# Patient Record
Sex: Female | Born: 1980 | Race: White | Hispanic: No | Marital: Married | Smoking: Never smoker
Health system: Southern US, Community
[De-identification: ages and names within clinical notes are randomized; demographics above are authoritative.]

## PROBLEM LIST (undated history)

## (undated) DIAGNOSIS — J982 Interstitial emphysema: Secondary | ICD-10-CM

## (undated) DIAGNOSIS — G43909 Migraine, unspecified, not intractable, without status migrainosus: Secondary | ICD-10-CM

## (undated) DIAGNOSIS — J45909 Unspecified asthma, uncomplicated: Secondary | ICD-10-CM

## (undated) HISTORY — PX: OVARIAN CYST REMOVAL: SHX89

## (undated) HISTORY — PX: APPENDECTOMY: SHX54

---
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
## 2001-01-25 ENCOUNTER — Ambulatory Visit (HOSPITAL_COMMUNITY): Admission: RE | Admit: 2001-01-25 | Discharge: 2001-01-25 | Payer: Self-pay | Admitting: Obstetrics & Gynecology

## 2001-01-25 ENCOUNTER — Encounter: Payer: Self-pay | Admitting: Obstetrics & Gynecology

## 2001-02-15 ENCOUNTER — Ambulatory Visit (HOSPITAL_COMMUNITY): Admission: RE | Admit: 2001-02-15 | Discharge: 2001-02-15 | Payer: Self-pay | Admitting: *Deleted

## 2001-02-23 ENCOUNTER — Encounter: Admission: RE | Admit: 2001-02-23 | Discharge: 2001-02-23 | Payer: Self-pay | Admitting: *Deleted

## 2001-02-23 ENCOUNTER — Encounter: Admission: AD | Admit: 2001-02-23 | Discharge: 2001-03-25 | Payer: Self-pay | Admitting: *Deleted

## 2001-03-08 ENCOUNTER — Encounter: Payer: Self-pay | Admitting: *Deleted

## 2001-03-09 ENCOUNTER — Inpatient Hospital Stay (HOSPITAL_COMMUNITY): Admission: AD | Admit: 2001-03-09 | Discharge: 2001-03-17 | Payer: Self-pay | Admitting: *Deleted

## 2001-03-11 ENCOUNTER — Encounter: Payer: Self-pay | Admitting: *Deleted

## 2001-03-15 ENCOUNTER — Encounter: Payer: Self-pay | Admitting: *Deleted

## 2001-03-23 ENCOUNTER — Inpatient Hospital Stay (HOSPITAL_COMMUNITY): Admission: AD | Admit: 2001-03-23 | Discharge: 2001-03-23 | Payer: Self-pay | Admitting: *Deleted

## 2001-03-23 ENCOUNTER — Encounter: Admission: RE | Admit: 2001-03-23 | Discharge: 2001-03-23 | Payer: Self-pay | Admitting: *Deleted

## 2001-03-27 ENCOUNTER — Inpatient Hospital Stay (HOSPITAL_COMMUNITY): Admission: AD | Admit: 2001-03-27 | Discharge: 2001-03-27 | Payer: Self-pay | Admitting: *Deleted

## 2001-03-30 ENCOUNTER — Encounter (HOSPITAL_COMMUNITY): Admission: RE | Admit: 2001-03-30 | Discharge: 2001-04-17 | Payer: Self-pay | Admitting: *Deleted

## 2001-04-05 ENCOUNTER — Encounter: Admission: RE | Admit: 2001-04-05 | Discharge: 2001-04-05 | Payer: Self-pay | Admitting: *Deleted

## 2001-04-06 ENCOUNTER — Encounter: Admission: RE | Admit: 2001-04-06 | Discharge: 2001-04-06 | Payer: Self-pay | Admitting: *Deleted

## 2001-04-13 ENCOUNTER — Encounter: Admission: RE | Admit: 2001-04-13 | Discharge: 2001-04-13 | Payer: Self-pay | Admitting: *Deleted

## 2001-04-13 ENCOUNTER — Inpatient Hospital Stay (HOSPITAL_COMMUNITY): Admission: AD | Admit: 2001-04-13 | Discharge: 2001-04-13 | Payer: Self-pay | Admitting: *Deleted

## 2001-04-20 ENCOUNTER — Encounter: Admission: RE | Admit: 2001-04-20 | Discharge: 2001-04-20 | Payer: Self-pay | Admitting: *Deleted

## 2001-04-20 ENCOUNTER — Inpatient Hospital Stay (HOSPITAL_COMMUNITY): Admission: AD | Admit: 2001-04-20 | Discharge: 2001-04-23 | Payer: Self-pay | Admitting: *Deleted

## 2001-11-17 ENCOUNTER — Emergency Department (HOSPITAL_COMMUNITY): Admission: EM | Admit: 2001-11-17 | Discharge: 2001-11-17 | Payer: Self-pay | Admitting: Emergency Medicine

## 2002-03-21 ENCOUNTER — Emergency Department (HOSPITAL_COMMUNITY): Admission: EM | Admit: 2002-03-21 | Discharge: 2002-03-22 | Payer: Self-pay | Admitting: Emergency Medicine

## 2002-03-23 ENCOUNTER — Emergency Department (HOSPITAL_COMMUNITY): Admission: EM | Admit: 2002-03-23 | Discharge: 2002-03-23 | Payer: Self-pay | Admitting: Emergency Medicine

## 2002-03-23 ENCOUNTER — Encounter: Payer: Self-pay | Admitting: Emergency Medicine

## 2002-08-24 ENCOUNTER — Emergency Department (HOSPITAL_COMMUNITY): Admission: EM | Admit: 2002-08-24 | Discharge: 2002-08-24 | Payer: Self-pay | Admitting: Emergency Medicine

## 2002-10-10 ENCOUNTER — Emergency Department (HOSPITAL_COMMUNITY): Admission: EM | Admit: 2002-10-10 | Discharge: 2002-10-10 | Payer: Self-pay

## 2002-10-10 ENCOUNTER — Encounter: Payer: Self-pay | Admitting: Emergency Medicine

## 2002-10-11 ENCOUNTER — Emergency Department (HOSPITAL_COMMUNITY): Admission: EM | Admit: 2002-10-11 | Discharge: 2002-10-12 | Payer: Self-pay | Admitting: Emergency Medicine

## 2003-12-12 ENCOUNTER — Inpatient Hospital Stay (HOSPITAL_COMMUNITY): Admission: AD | Admit: 2003-12-12 | Discharge: 2003-12-12 | Payer: Self-pay | Admitting: Obstetrics and Gynecology

## 2003-12-24 ENCOUNTER — Ambulatory Visit (HOSPITAL_COMMUNITY): Admission: RE | Admit: 2003-12-24 | Discharge: 2003-12-24 | Payer: Self-pay | Admitting: *Deleted

## 2004-03-03 ENCOUNTER — Ambulatory Visit (HOSPITAL_COMMUNITY): Admission: RE | Admit: 2004-03-03 | Discharge: 2004-03-03 | Payer: Self-pay | Admitting: *Deleted

## 2004-04-08 ENCOUNTER — Inpatient Hospital Stay (HOSPITAL_COMMUNITY): Admission: AD | Admit: 2004-04-08 | Discharge: 2004-04-09 | Payer: Self-pay | Admitting: Obstetrics and Gynecology

## 2004-04-15 ENCOUNTER — Observation Stay (HOSPITAL_COMMUNITY): Admission: AD | Admit: 2004-04-15 | Discharge: 2004-04-16 | Payer: Self-pay | Admitting: Obstetrics and Gynecology

## 2004-04-15 ENCOUNTER — Ambulatory Visit: Payer: Self-pay | Admitting: Obstetrics and Gynecology

## 2004-04-17 ENCOUNTER — Inpatient Hospital Stay (HOSPITAL_COMMUNITY): Admission: AD | Admit: 2004-04-17 | Discharge: 2004-04-17 | Payer: Self-pay | Admitting: Family Medicine

## 2004-05-28 ENCOUNTER — Ambulatory Visit (HOSPITAL_COMMUNITY): Admission: RE | Admit: 2004-05-28 | Discharge: 2004-05-28 | Payer: Self-pay | Admitting: Family Medicine

## 2004-06-25 ENCOUNTER — Ambulatory Visit: Payer: Self-pay | Admitting: Obstetrics and Gynecology

## 2004-06-25 ENCOUNTER — Inpatient Hospital Stay (HOSPITAL_COMMUNITY): Admission: AD | Admit: 2004-06-25 | Discharge: 2004-06-25 | Payer: Self-pay | Admitting: *Deleted

## 2004-06-30 ENCOUNTER — Ambulatory Visit: Payer: Self-pay | Admitting: *Deleted

## 2004-06-30 ENCOUNTER — Inpatient Hospital Stay (HOSPITAL_COMMUNITY): Admission: AD | Admit: 2004-06-30 | Discharge: 2004-07-01 | Payer: Self-pay | Admitting: Obstetrics and Gynecology

## 2004-07-30 ENCOUNTER — Inpatient Hospital Stay (HOSPITAL_COMMUNITY): Admission: AD | Admit: 2004-07-30 | Discharge: 2004-08-02 | Payer: Self-pay | Admitting: Family Medicine

## 2004-07-30 ENCOUNTER — Ambulatory Visit: Payer: Self-pay | Admitting: *Deleted

## 2005-09-23 ENCOUNTER — Emergency Department (HOSPITAL_COMMUNITY): Admission: EM | Admit: 2005-09-23 | Discharge: 2005-09-23 | Payer: Self-pay | Admitting: Emergency Medicine

## 2007-05-26 ENCOUNTER — Inpatient Hospital Stay (HOSPITAL_COMMUNITY): Admission: EM | Admit: 2007-05-26 | Discharge: 2007-06-06 | Payer: Self-pay | Admitting: Emergency Medicine

## 2007-05-26 ENCOUNTER — Ambulatory Visit: Payer: Self-pay | Admitting: Surgery

## 2008-02-29 ENCOUNTER — Ambulatory Visit: Payer: Self-pay | Admitting: Critical Care Medicine

## 2008-02-29 DIAGNOSIS — J45909 Unspecified asthma, uncomplicated: Secondary | ICD-10-CM | POA: Insufficient documentation

## 2008-03-01 ENCOUNTER — Encounter: Payer: Self-pay | Admitting: Critical Care Medicine

## 2008-03-04 DIAGNOSIS — R0602 Shortness of breath: Secondary | ICD-10-CM

## 2008-03-05 ENCOUNTER — Ambulatory Visit: Payer: Self-pay | Admitting: Internal Medicine

## 2008-03-06 ENCOUNTER — Encounter: Payer: Self-pay | Admitting: Critical Care Medicine

## 2008-03-14 ENCOUNTER — Ambulatory Visit: Payer: Self-pay | Admitting: Internal Medicine

## 2008-03-14 ENCOUNTER — Encounter: Payer: Self-pay | Admitting: Critical Care Medicine

## 2008-03-14 ENCOUNTER — Ambulatory Visit (HOSPITAL_COMMUNITY): Admission: RE | Admit: 2008-03-14 | Discharge: 2008-03-14 | Payer: Self-pay | Admitting: Critical Care Medicine

## 2008-03-14 ENCOUNTER — Inpatient Hospital Stay (HOSPITAL_COMMUNITY): Admission: EM | Admit: 2008-03-14 | Discharge: 2008-03-21 | Payer: Self-pay | Admitting: Emergency Medicine

## 2008-03-15 ENCOUNTER — Ambulatory Visit: Payer: Self-pay | Admitting: Surgery

## 2008-03-18 ENCOUNTER — Ambulatory Visit: Payer: Self-pay | Admitting: Gastroenterology

## 2008-03-21 ENCOUNTER — Encounter: Payer: Self-pay | Admitting: Critical Care Medicine

## 2008-09-23 ENCOUNTER — Inpatient Hospital Stay (HOSPITAL_COMMUNITY): Admission: AD | Admit: 2008-09-23 | Discharge: 2008-09-23 | Payer: Self-pay | Admitting: Obstetrics and Gynecology

## 2008-10-21 ENCOUNTER — Inpatient Hospital Stay (HOSPITAL_COMMUNITY): Admission: AD | Admit: 2008-10-21 | Discharge: 2008-10-21 | Payer: Self-pay | Admitting: Obstetrics and Gynecology

## 2008-11-04 ENCOUNTER — Inpatient Hospital Stay (HOSPITAL_COMMUNITY): Admission: AD | Admit: 2008-11-04 | Discharge: 2008-11-04 | Payer: Self-pay | Admitting: Obstetrics and Gynecology

## 2008-11-14 ENCOUNTER — Inpatient Hospital Stay (HOSPITAL_COMMUNITY): Admission: AD | Admit: 2008-11-14 | Discharge: 2008-11-16 | Payer: Self-pay | Admitting: Obstetrics and Gynecology

## 2010-02-18 ENCOUNTER — Emergency Department (INDEPENDENT_AMBULATORY_CARE_PROVIDER_SITE_OTHER): Payer: Self-pay

## 2010-02-18 ENCOUNTER — Emergency Department (HOSPITAL_BASED_OUTPATIENT_CLINIC_OR_DEPARTMENT_OTHER)
Admission: EM | Admit: 2010-02-18 | Discharge: 2010-02-18 | Disposition: A | Discharge status: Home / Self Care | Payer: Self-pay | Attending: Emergency Medicine | Admitting: Emergency Medicine

## 2010-02-18 DIAGNOSIS — W1809XA Striking against other object with subsequent fall, initial encounter: Secondary | ICD-10-CM | POA: Insufficient documentation

## 2010-02-18 DIAGNOSIS — W19XXXA Unspecified fall, initial encounter: Secondary | ICD-10-CM

## 2010-02-18 DIAGNOSIS — S63509A Unspecified sprain of unspecified wrist, initial encounter: Secondary | ICD-10-CM | POA: Insufficient documentation

## 2010-02-18 DIAGNOSIS — M79609 Pain in unspecified limb: Secondary | ICD-10-CM

## 2010-02-18 DIAGNOSIS — M25539 Pain in unspecified wrist: Secondary | ICD-10-CM

## 2010-03-13 ENCOUNTER — Emergency Department (INDEPENDENT_AMBULATORY_CARE_PROVIDER_SITE_OTHER): Payer: Self-pay

## 2010-03-13 ENCOUNTER — Emergency Department (HOSPITAL_BASED_OUTPATIENT_CLINIC_OR_DEPARTMENT_OTHER)
Admission: EM | Admit: 2010-03-13 | Discharge: 2010-03-13 | Disposition: A | Discharge status: Home / Self Care | Payer: Self-pay | Attending: Emergency Medicine | Admitting: Emergency Medicine

## 2010-03-13 DIAGNOSIS — R0789 Other chest pain: Secondary | ICD-10-CM | POA: Insufficient documentation

## 2010-03-13 DIAGNOSIS — R1110 Vomiting, unspecified: Secondary | ICD-10-CM

## 2010-03-13 DIAGNOSIS — R071 Chest pain on breathing: Secondary | ICD-10-CM | POA: Insufficient documentation

## 2010-03-13 DIAGNOSIS — H81399 Other peripheral vertigo, unspecified ear: Secondary | ICD-10-CM | POA: Insufficient documentation

## 2010-03-13 DIAGNOSIS — R112 Nausea with vomiting, unspecified: Secondary | ICD-10-CM

## 2010-03-13 DIAGNOSIS — R079 Chest pain, unspecified: Secondary | ICD-10-CM

## 2010-03-13 DIAGNOSIS — R072 Precordial pain: Secondary | ICD-10-CM

## 2010-03-13 DIAGNOSIS — J45909 Unspecified asthma, uncomplicated: Secondary | ICD-10-CM | POA: Insufficient documentation

## 2010-03-13 DIAGNOSIS — R42 Dizziness and giddiness: Secondary | ICD-10-CM

## 2010-03-13 LAB — CBC
HCT: 41.6 % (ref 36.0–46.0)
Hemoglobin: 14.6 g/dL (ref 12.0–15.0)
MCHC: 35.1 g/dL (ref 30.0–36.0)
MCV: 83 fL (ref 78.0–100.0)
RDW: 12.5 % (ref 11.5–15.5)

## 2010-03-13 LAB — BASIC METABOLIC PANEL
BUN: 11 mg/dL (ref 6–23)
CO2: 25 mEq/L (ref 19–32)
Calcium: 9.3 mg/dL (ref 8.4–10.5)
GFR calc non Af Amer: >60 mL/min (ref >60)
Glucose, Bld: 114 mg/dL — ABNORMAL HIGH (ref 70–99)
Potassium: 4.5 mEq/L (ref 3.5–5.1)
Sodium: 145 mEq/L (ref 135–145)

## 2010-03-13 LAB — DIFFERENTIAL
Basophils Absolute: 0 10*3/uL (ref 0.0–0.1)
Eosinophils Relative: 0 % (ref 0–5)
Lymphocytes Relative: 10 % — ABNORMAL LOW (ref 12–46)
Lymphs Abs: 1.3 10*3/uL (ref 0.7–4.0)
Monocytes Absolute: 0.6 10*3/uL (ref 0.1–1.0)
Neutro Abs: 10.8 10*3/uL — ABNORMAL HIGH (ref 1.7–7.7)

## 2010-03-13 LAB — URINALYSIS, ROUTINE W REFLEX MICROSCOPIC
Bilirubin Urine: NEGATIVE
Hgb urine dipstick: NEGATIVE
Ketones, ur: NEGATIVE mg/dL
Nitrite: NEGATIVE
Specific Gravity, Urine: 1.013 (ref 1.005–1.030)
pH: 7.5 (ref 5.0–8.0)

## 2010-03-13 LAB — D-DIMER, QUANTITATIVE: D-Dimer, Quant: <0.22 ug/mL-FEU (ref 0.00–0.48)

## 2010-03-13 MED ORDER — IOHEXOL 350 MG/ML SOLN
80.0000 mL | Freq: Once | INTRAVENOUS | Status: AC | PRN
  Administered 2010-03-13: 80 mL via INTRAVENOUS

## 2010-04-08 LAB — CBC
HCT: 26.5 % — ABNORMAL LOW (ref 36.0–46.0)
HCT: 26.8 % — ABNORMAL LOW (ref 36.0–46.0)
Hemoglobin: 8.7 g/dL — ABNORMAL LOW (ref 12.0–15.0)
Hemoglobin: 8.8 g/dL — ABNORMAL LOW (ref 12.0–15.0)
MCHC: 33 g/dL (ref 30.0–36.0)
MCV: 80.4 fL (ref 78.0–100.0)
MCV: 80.8 fL (ref 78.0–100.0)
RBC: 3.29 MIL/uL — ABNORMAL LOW (ref 3.87–5.11)
RDW: 14.6 % (ref 11.5–15.5)
WBC: 11.3 10*3/uL — ABNORMAL HIGH (ref 4.0–10.5)

## 2010-04-16 LAB — DIFFERENTIAL
Eosinophils Relative: 0 % (ref 0–5)
Lymphocytes Relative: 16 % (ref 12–46)
Lymphs Abs: 1.8 10*3/uL (ref 0.7–4.0)
Monocytes Relative: 5 % (ref 3–12)
Neutrophils Relative %: 79 % — ABNORMAL HIGH (ref 43–77)

## 2010-04-16 LAB — CBC
MCHC: 35.3 g/dL (ref 30.0–36.0)
MCV: 87.2 fL (ref 78.0–100.0)
RBC: 4.24 MIL/uL (ref 3.87–5.11)
RDW: 12.5 % (ref 11.5–15.5)

## 2010-04-16 LAB — COMPREHENSIVE METABOLIC PANEL
AST: 27 U/L (ref 0–37)
CO2: 25 mEq/L (ref 19–32)
Calcium: 8.6 mg/dL (ref 8.4–10.5)
Creatinine, Ser: 0.64 mg/dL (ref 0.4–1.2)
GFR calc Af Amer: >60 mL/min (ref >60)
GFR calc non Af Amer: >60 mL/min (ref >60)
Total Protein: 6.5 g/dL (ref 6.0–8.3)

## 2010-04-16 LAB — PROTIME-INR
INR: 1 (ref 0.00–1.49)
Prothrombin Time: 13.7 seconds (ref 11.6–15.2)

## 2010-04-16 LAB — BASIC METABOLIC PANEL
BUN: 11 mg/dL (ref 6–23)
CO2: 25 mEq/L (ref 19–32)
Calcium: 9.3 mg/dL (ref 8.4–10.5)
Chloride: 98 mEq/L (ref 96–112)
Creatinine, Ser: 0.7 mg/dL (ref 0.4–1.2)
Glucose, Bld: 91 mg/dL (ref 70–99)

## 2010-05-19 NOTE — H&P (Signed)
Amber Weber, POKORNY                ACCOUNT NO.:  [id number]   MEDICAL RECORD NO.:  [id number]          PATIENT TYPE:  INP   LOCATION:  3740                         FACILITY:  [facility]   PHYSICIAN:  Clinton D. Maple Hudson, MD, FCCP, FACPDATE OF BIRTH:  September 19, 1981   DATE OF ADMISSION:  03/14/2008  DATE OF DISCHARGE:                              HISTORY & PHYSICAL   ADMISSION DIAGNOSES:  1. Spontaneous pneumomediastinum.  2. Asthma.   HISTORY OF PRESENT ILLNESS:  This is a 30 year old, otherwise, healthy  nonsmoking white female admitted through the emergency room with chief  complaint of chest pain and pneumomediastinum.  She has a history of  asthma back to childhood with 1 or 2 episodes a year.  She had required  emergency room visits as a child, but not since and her control has been  good with no routine medicines now.  She has a history of a spontaneous  pneumomediastinum in May 2009 requiring hospitalization, but treated  conservatively.  Pain management was an issue, as air dissected up into  her neck and epiglottal region.  She had seen Dr. Delford Field in the office  about 2 weeks ago and was sent for a methacholine inhalation challenge  test today.  I will tract down that record, but by her description the  test was stopped in the first stage with a technician saying that there  was no need to continue.  She was waiting to leave for home when she  became dyspneic progressively with anterior/substernal chest pain, which  did not respond to a nebulizer treatment.  She was sent to the emergency  room.  Initial chest x-ray was negative, but CT scan showed  pneumomediastinum without obvious source defect.  Subsequently, pain has  been treated with morphine and Dilaudid.  She remains dyspneic, now with  tightness and substernal pressure sensation.   PAST MEDICAL HISTORY:  Appendectomy and ovarian cyst.  She think she had  pneumonia in May 2009 while in the hospital for pneumomediastinum,  childbirth.   FAMILY HISTORY:  Father died around age 40 of lung cancer, heavy smoker.  Mother is alive and well.  There is no known family history, otherwise,  of lung disease or liver disease.   SOCIAL HISTORY:  Married with young children.  Works as a Conservator, museum/gallery.  Nonsmoker.  Occasional social alcohol.  She has had both flu  vaccines for this year.   REVIEW OF SYSTEMS:  Tight sensation in the chest, partly relieved by  taking a deep stretching at the side, but aggravated by speech.  This is  more of a pressure sensation than the pain currently.  She denies cough  or wheeze, fever or chills, bloody or purulent discharge.  She had not  recently felt sick with any particular discomfort.  She has not had rash  or adenopathy, headache, blurred vision or slurred speech, syncope,  palpitation or exertional chest pain, nausea, vomiting or reflux,  abdominal pain, change in bowel or bladder, leg pain or swelling of  feet.  There had been no arthralgias.  She is currently saying  that her  throat hurts some, but she does not have a history of dysphagia.  She  has not felt completely easy in her chest since her first  pneumomediastinum a year ago with a waxing and waning sense of  substernal tightness, sometimes difficulty taking a full breath.   OBJECTIVE:  VITAL SIGNS:  Temperature 98.3, BP 120/77, pulse 102, and  respiration 20.  Initial room air saturation was 99%.  GENERAL APPEARANCE:  Calm, alert woman taking an occasional deep sighing  breath.  She is alert, oriented, and pleasant.  SKIN:  Warm and dry.  NECK:  Adenopathy:  None found at the neck, supraclavicular, or axillary  areas.  There is no neck vein distention, stridor, or thyromegaly.  HEENT:  Speech is clear.  Tongue protrudes midline with dry oral mucosa.  CHEST:  Quiet bilateral breath sounds unlabored without wheeze, cough,  rales or rhonchi noted.  Absence of crepitus in the skin and across the  chest.  HEART:   Regular rhythm.  Normal S1 and S2.  No rub or crunch.  ABDOMEN:  Soft without splenomegaly.  Bowel sounds are faintly heard.  EXTREMITIES:  Normal with no cyanosis, clubbing, or edema.  No calf  tenderness or arthritis, is apparent.  PELVIC:  Breast, genitalia, and rectal, not examined and not pertinent.  NEUROLOGIC:  Alert and oriented, appropriately responsive, moving all  extremities.  Extraocular muscles are intact.  Tongue protrudes midline.  Gross hearing and vision are intact.   LABORATORY:  I reviewed the radiology images.  Chest CT shows a  pneumomediastinum.  Lung parenchyma is clear.   IMPRESSION:  Recurrent spontaneous pneumomediastinum.  This episode may  have been associated with the forced expiratory efforts of the  methacholine inhalation challenge test.  We will admit her to an  inpatient telemetry bed for close monitoring, service of Dr. Shan Levans and I will ask Thoracic Surgery consultation to follow her with  [place].  We will maintain bronchodilator status with standard asthma  management for now.      Clinton D. Maple Hudson, MD, Tonny Bollman, FACP  Electronically Signed     CDY/MEDQ  D:  03/14/2008  T:  03/15/2008  Job:  [id number]   cc:   Charlcie Cradle. Delford Field, MD, FCCP

## 2010-05-19 NOTE — H&P (Signed)
NAMESINA, Amber Weber                ACCOUNT NO.:  [id number]   MEDICAL RECORD NO.:  [id number]          PATIENT TYPE:  EMS   LOCATION:  MAJO                         FACILITY:  [facility]   PHYSICIAN:  Madaline Savage, MD        DATE OF BIRTH:  [birth year]   DATE OF ADMISSION:  05/25/2007  DATE OF DISCHARGE:                              HISTORY & PHYSICAL   PRIMARY CARE PHYSICIAN:  None.  This patient is unassigned to [place].   CHIEF COMPLAINT:  Shortness of breath.   HISTORY OF PRESENT ILLNESS:  [name] is a 30 year old Caucasian lady  with a history of bronchial asthma who comes in with shortness of  breath.  She states her asthma is usually well controlled.  She states  has up to two attacks a year and her last attack was sometime in  September last year.  Three days ago she suddenly started feeling short  of breath.  She went to [facility].  She was given some  breathing treatments there and she was started on steroids and some  inhalers.  She took them but her symptoms did not improve and for the  last couple of days her progress symptoms have been progressively been  getting worse, so she went to the Urgent Care today and so she was sent  him here to rule out a pulmonary embolism.  At this time she does state  that her breathing gets worse when she moves around, and she also  complains of some chest discomfort.  She denies any fever or chills at  this time.   PAST MEDICAL HISTORY:  Is significant for asthma.   PAST SURGICAL HISTORY:  She had an appendectomy and ovarian cyst  removal.   ALLERGIES:  No known drug allergies.   CURRENT MEDICATIONS:  She does not take any medications regularly at  home.  She was started on cefdinir, Asmanex inhaler, prednisone and  Proventil at the [facility] 2 days ago.   SOCIAL HISTORY:  There is no history of smoking.  She takes alcohol  occasionally.  Denies any history of drug abuse.  She works as a  Geophysicist/field seismologist.   FAMILY HISTORY:  Her father died at the age of 59 from lung cancer.  Her  mother is 78.  She is healthy.  She is two brothers who are healthy.   REVIEW OF SYSTEMS:  GENERAL:  She denies any recent weight loss, weight  gain.  No fever or chills.  HEENT:  No headaches, no blurred vision.  No  sore throat.  CARDIOVASCULAR:  No chest pain or palpitations.  RESPIRATORY SYSTEM:  She does have shortness of breath but no cough.  GI:  No abdominal pain, nausea, vomiting, diarrhea, constipation.   PHYSICAL EXAMINATION:  She is alert and oriented x3.  VITAL SIGNS:  Temperature is 98, pulse 74, blood pressure 117/85,  respiratory rate 18, oxygen saturation 100% on room air.  HEENT:  Head atraumatic, normocephalic.  Pupils bilaterally equal and  react to light.  Mucous membranes are moist.  NECK:  Supple.  No JVD, no carotid bruit.  CARDIOVASCULAR SYSTEM:  S1, S2 heard.  Regular rate and rhythm.  CHEST:  Decreased air entry bilaterally.  ABDOMEN:  Soft.  Bowel sounds heard.   LABORATORIES:  Show a white count of 17.1, hemoglobin 14.7, platelets  260.  BNP is less than 30.  Sodium is 139, potassium 4.3, BUN 12,  creatinine 0.75.  Troponin less than 0.05.  D-dimer is 0.24.  CT of the  chest showed pneumomediastinum.   IMPRESSION:  1. Acute asthma exacerbation.  2. Pneumomediastinum.  3. Leukocytosis.   PLAN:  1. Asthma exacerbation.  This is a 30 year old lady with a history of      asthma who comes in with an asthma exacerbation.  We will treat her      with nebulizers, IV steroids and oxygen.  I will ask them to check      peak flow on her after her breathing treatments.  2. Pneumomediastinum.  She does have a pneumomediastinum in the CT of      chest.  This is likely probably secondary to heavy breathing and      possible auto-PEEP with increase of pressure.  At this time there      does not seem to be a tension pneumomediastinum.  We will follow      her while she is in the hospital.  Will  get a follow-up x-ray chest      to see its progression.  We can consider calling cardiothoracic      surgeon to see if it is suspected that she has a tension      pneumomediastinum.  3. Leukocytosis.  She does have elevated wbc count but she has been on      steroids for the last couple of days.  We will watch her while she      is in the hospital.  4. I will put her on DVT prophylaxis.      Madaline Savage, MD  Electronically Signed     PKN/MEDQ  D:  05/26/2007  T:  05/26/2007  Job:  [id number]

## 2010-05-19 NOTE — Discharge Summary (Signed)
NAMEKYNNEDI, ZWEIG                ACCOUNT NO.:  [id number]   MEDICAL RECORD NO.:  [id number]          PATIENT TYPE:  INP   LOCATION:  3029                         FACILITY:  [facility]   PHYSICIAN:  Lonia Blood, M.D.DATE OF BIRTH:  July 31, 1980   DATE OF ADMISSION:  05/25/2007  DATE OF DISCHARGE:                               DISCHARGE SUMMARY   To Whom It May Concern:   [name] is a 30 year old female who has been under my care as  an inpatient at [facility] from May 25, 2007 through June 05, 2007.  She  has suffered with a significant asthma exacerbation and a  pneumomediastinum.  Due to significant physical debility and ongoing  pain, she has been advised to refrain from work for a minimum of an  additional week from the time of this dictated summary.  The earliest at  which I would expect her to be ready for return to work would be June 12, 2007.  At the present time, however, I would state that her return date  would be indefinite and recommend that she follow up with her primary  care physician/[facility] prior to June 12, 2007 to determine  the most appropriate date at which she could return to work.   If there are any questions or concerns regarding this letter, please  feel free to contact the office at [phone].      Lonia Blood, M.D.  Electronically Signed     JTM/MEDQ  D:  06/05/2007  T:  06/05/2007  Job:  [id number]

## 2010-05-19 NOTE — Discharge Summary (Signed)
Amber Weber, Amber Weber                ACCOUNT NO.:  [id number]   MEDICAL RECORD NO.:  [id number]          PATIENT TYPE:  INP   LOCATION:  3029                         FACILITY:  [facility]   PHYSICIAN:  Lonia Blood, M.D.DATE OF BIRTH:  Nov 16, 1980   DATE OF ADMISSION:  05/25/2007  DATE OF DISCHARGE:  06/05/2007                               DISCHARGE SUMMARY   PRIMARY CARE PHYSICIAN:  Unassigned/[facility].   DISCHARGE DIAGNOSES:  1. Pneumomediastinum.      a.     Felt to be secondary to ruptured asthmatic blebs.      b.     Significantly improved with follow-up CT scan.      c.     Ongoing odynophagia secondary to localization and epiglottis       - improving at discharge.  2. Asthma with acute asthma exacerbation - resolved.  3. Status post appendectomy.  4. Status post ovarian cyst resection.   ALLERGIES:  NO KNOWN DRUG ALLERGIES.   DISCHARGE MEDICATIONS:  1. Proventil inhaler 1-2 puffs q.4 h., p.r.n. wheezing.  2. Ultram 50 mg 1-2 p.o. q.4 h., p.r.n. pain   FOLLOW UP:  The patient is advised [facility]  within 5 days for reevaluation of her of odynophagia.  If her symptoms  persist, a repeat CT scan of the chest and neck to reevaluate her  pneumomediastinum is recommended.  It is expected at that time that all  air with be completely reabsorbed.   CONSULTATIONS:  Dr. Evelene Croon with cardiothoracic surgery.   PROCEDURES:  1. CT scan the chest, May 27, 2007 - pneumomediastinum with no      findings to suggest acute PE.  Lungs clear of an active process.  2. CT scan of the chest, May 31, 2007 - small volume gas in the      retropharyngeal space of the neck partially visualized at the level      of the glottis, likely having tracked cephalad from the      mediastinum.  Interval resolution of pneumomediastinum.  No acute      cardiopulmonary abnormality.   HOSPITAL COURSE:  Amber Weber is a 30 year old otherwise healthy  female who  presented to the hospital on Jun 02, 2007, with complaints of  severe shortness of breath.  Three days prior to her presentation to the  hospital., she had suffered the acute onset of these symptoms.  She was  evaluated at [facility].  She was given breathing treatments  for an asthma exacerbation and started on steroids, as well as inhalers.  Her symptoms unfortunately did not improve, and she presented to Urgent  [facility] at [facility] for reevaluation.  Out of concern for the possibility of  pulmonary embolism, the patient was sent to the [facility] emergency  room.  CT scan of the chest was carried out which revealed significant  pneumomediastinum, but no evidence of pulmonary embolism.  The patient  was admitted to the acute unit for pain control.  IV analgesics were  administered.  The patient did indeed appear  to still be suffering with  an acute asthma exacerbation and required intravenous steroids, as well  as the typical bronchodilator therapies.  With aggressive management via  these mechanisms, the patient's asthma exacerbation resolved.  Cardiothoracic surgery was consulted and agreed with the primary team  that the likely source of the patient's pneumomediastinum was a  spontaneous rupture of an asthmatic bleb.  No aggressive intervention  was indicated.  With ongoing analgesia, the patient slowly improved.  Follow-up CT scan did in fact reveal resolution of the patient's  pneumomediastinum, but not surprisingly had now indicated that the  patient's free air had moved up into the neck with a significant focus  around the epiglottis.  With this, the patient suffered with significant  of odynophagia.  This extended her hospital stay due to her inability to  maintain adequate p.o. intake.  Intravenous crystalloid support was  continued.  At such time that the patient's symptoms improved  sufficiently to allow p.o. intake, she was cleared for discharge home.  At the time of  her discharge, lungs are clear with no active wheezing.  Vital signs were stable and afebrile.   The patient was counseled quite extensively as to the nature of her  asthma.  She reports that it is usually very well controlled and that  she suffers only 1-2 episodes per year at a maximum.  She was therefore  not felt to be an appropriate candidate for daily use of inhaled steroid  therapy.  She is encouraged to continue to use her albuterol on a p.r.n.  basis.  She is encouraged to report to her primary care physician the  frequency with which she has to use this.  If the patient's asthma  appears to be less well controlled than she has indicated, then one may  consider the resumption of Advair or other inhaled steroid.   On June 05, 2007, the patient was cleared for discharge with stable  vital signs and in an afebrile state.  She will continue Ultram for pain  control.      Lonia Blood, M.D.  Electronically Signed     JTM/MEDQ  D:  06/05/2007  T:  06/05/2007  Job:  [id number]   cc:   [facility]  [facility]

## 2010-05-19 NOTE — Consult Note (Signed)
NAMEBREIANA, Amber Weber                ACCOUNT NO.:  [id number]   MEDICAL RECORD NO.:  [id number]          PATIENT TYPE:  INP   LOCATION:  3740                         FACILITY:  [facility]   PHYSICIAN:  Evelene Croon, M.D.     DATE OF BIRTH:  Mar 09, 1980   DATE OF CONSULTATION:  03/15/2008  DATE OF DISCHARGE:                                 CONSULTATION   REFERRING PHYSICIAN:  Clinton D. Maple Hudson, MD, FCCP, FACP.   REASON FOR CONSULTATION:  Pneumomediastinum.   CLINICAL HISTORY:  I was asked by Dr. Maple Hudson to evaluate Amber Weber for  recurrent pneumomediastinum.  She is a 30 year old woman with a history  of asthma, who I initially saw in May of 2009.  She presented with a  spontaneous pneumomediastinum, coughing, severe chest pain and some  difficulty swallowing as well as shortness of breath.  She had no  pneumothorax at that time and was treated conservatively with pain  medication.  Her symptoms gradually improved.  She said that she  continued to have intermittent episodes where she feels that she cannot  take a full breath and these are relatively short lived.  She said that  she was undergoing a past methacholine inhalation challenge test by Dr.  Delford Field on March 14, 2008.  She said that the test was stopped part of  the way through.  She said that she was waiting to go home when she  became short of breath and developed substernal chest pain similar to  what she had had in the past.  This did not respond to nebulizer  treatment, and she was sent to the emergency room.  Her initial chest x-  ray was negative.  A CT scan of the chest showed pneumomediastinum  without pneumothorax.   PAST MEDICAL HISTORY:  Significant only for asthma.   PAST SURGICAL HISTORY:  Significant for appendectomy and ovarian cyst  removal in the past.   REVIEW OF SYSTEMS:  GENERAL:  She denies any fever or chills.  She has  never had recent weight changes.  She denies fatigue.  EYES:  Negative.  ENT:  Negative.   ENDOCRINE:  She denies diabetes and hypothyroidism.  CARDIOVASCULAR:  She does have some intermittent shortness of breath at  rest and she feels like she cannot take a full breath.  She denies any  chest pain until this episode prompting admission.  She has had no PND  or orthopnea.  She denies peripheral edema.  RESPIRATORY:  She denies  cough and sputum production.  She does have a history of asthma.  GI:  She denies nausea or vomiting.  She has had no melena or bright red  blood per rectum.  GU:  She denies dysuria and hematuria.  NEUROLOGICAL:  She denies any focal weakness or numbness.  She denies dizziness,  syncope.  She has never had a TIA or stroke.  HEMATOLOGICAL:  Negative.  PSYCHIATRIC:  Negative.   ALLERGIES:  None.   SOCIAL HISTORY:  She is a nonsmoker.  She drinks occasional alcohol.  She works as a Geophysicist/field seismologist.  FAMILY HISTORY:  Father died at age 1 from lung cancer.  Her mother is  64 and healthy.  She has 2 brothers who are healthy.   PHYSICAL EXAMINATION:  VITAL SIGNS:  She is afebrile.  Blood pressure is  118/80.  Pulse is 100 and regular.  Respiratory rate is 20 and  unlabored.  Oxygen saturation is 99%.  GENERAL:  She is a well-developed white female in no distress who takes  frequent deep sighs.  HEENT:  Normocephalic and atraumatic.  Pupils are equal and reactive to  light and accommodation.  Extraocular muscles are intact.  Throat is  clear.  NECK:  Normal carotid pulses bilaterally.  There are no bruits.  There  is no adenopathy or thyromegaly.  There is no crepitance.  CARDIAC:  Regular rate and rhythm with normal S1 and S2.  There is no murmur, rub,  or gallop.  LUNGS:  Clear.  ABDOMEN:  Active bowel sounds.  Her abdomen is soft, nontender.  There  are no palpable masses or organomegaly.  EXTREMITIES:  No peripheral edema.  NEUROLOGIC:  Alert and oriented x3.  Motor and sensory exams are grossly  normal.  SKIN:  Warm and dry.   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  Most likely, this is secondary to a small bleb along  the mediastinal surface of the lungs and is ruptured into the  mediastinum.  It is not possible to localize the side of the chest  involved without development of a pneumothorax on that side.  This  recurrent pneumomediastinum may have been brought on by the tests that  she had done.  I do not think this is related to an esophageal  perforation.  I would recommend continued conservative therapy with pain  management.  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      Evelene Croon, M.D.  Electronically Signed     BB/MEDQ  D:  03/16/2008  T:  03/16/2008  Job:  [id number]

## 2010-05-19 NOTE — Consult Note (Signed)
NAMEBONNEY, BERRES                ACCOUNT NO.:  [id number]   MEDICAL RECORD NO.:  [id number]          PATIENT TYPE:  INP   LOCATION:  2631                         FACILITY:  [facility]   PHYSICIAN:  Evelene Croon, M.D.     DATE OF BIRTH:  April 20, 1980   DATE OF CONSULTATION:  05/26/2007  DATE OF DISCHARGE:                                 CONSULTATION   REFERRING PHYSICIAN:  Madaline Savage, MD   REASON FOR CONSULTATION:  Pneumomediastinum.   CLINICAL HISTORY:  I was asked by Dr. Darene Lamer to evaluate Ms. Bonn for  pneumomediastinum seen on CT scan of the chest.  She is a 30 year old  white female with a history of asthma who has had no recent  exacerbations, but developed sudden shortness of breath on Wednesday  while doing no strenuous activity.  There was no coughing before hand.  She said she was unable to drive home.  She developed some central chest  discomfort.  She went to the [facility], where she was  treated for possible asthma exacerbation and was given steroids and  inhaler.  There was no improvement and she returned to an Urgent Care on  [place] today.  She was referred to [facility] and was admitted after a  CT scan of the chest showed no evidence of pulmonary embolism, but did  showed pneumomediastinum.  There were no other abnormalities seen.   PAST MEDICAL HISTORY:  Significant for asthma.  She has had history of  prior appendectomy and ovarian cystectomy.   REVIEW OF SYSTEMS:  GENERAL:  She denies any fever or chills.  She has  had no recent weight changes.  She denies weakness.  ENT:  Negative.  ENDOCRINE:  She denies diabetes and hypothyroidism.  CARDIOVASCULAR:  She has had no history of chest pain or pressure.  She has had no  palpitations.  She denies PND or orthopnea.  She has had no exertional  dyspnea.  RESPIRATORY:  She denies cough and sputum production.  GI:  She denies nausea or vomiting.  She has had no dysphagia.  GU:  She has  had no melena or bright  red blood per rectum.  The remainder of review  of systems is negative.   SOCIAL HISTORY:  She is a nonsmoker and denies alcohol abuse.  She is  married.   FAMILY HISTORY:  Negative.   ALLERGIES:  None.   MEDICATIONS PRIOR TO ADMISSION:  Are as noted on her NAR in this chart.   PHYSICAL EXAMINATION:  VITAL SIGNS:  Blood pressure 114/85, pulse 75 and  regular, and respiratory rate is 18 unlabored.  Oxygen saturation on  room air is 100%.  She is afebrile.  GENERAL:  She is a well-developed white female in no distress.  HEENT:  Normocephalic and atraumatic.  Pupils are equal and reactive to  light.  Extraocular muscles are intact.  Throat is clear.  NECK:  Normal carotid pulses bilaterally.  There are no bruits.  There  is no crepitus.  There was no adenopathy or thyromegaly.  CARDIAC:  Regular rate and  rhythm with normal S1 and S2.  There is no  murmur, rub, or gallop.  LUNGS:  Clear.  There is no subcutaneous emphysema.  ABDOMEN:  Active bowel sounds.  Abdomen is soft and nontender.  There  are no palpable masses or organomegaly.  There is no peripheral edema.   IMPRESSION:  This patient has a history of asthma and now a first  episode of pneumomediastinum.  I suspect this is most likely related to  rupture of a small bleb into the mediastinum.  I do not see any specific  blebs of any size on CT scan of the chest, but this could be a very  small bleb that would not show up on CT scan.  Most of the time this  blebs rupture in to the pleural space causing pneumothorax, but  occasionally can cause pneumomediastinum.  There was no specific  treatment for this and most likely the air will resolve on its own over  the next few weeks.  She may continue to have mild shortness of breath,  sensation, and some chest pains until this resolves.  She should have a  chest x-ray done tomorrow and if there is no change she could be  discharged home with your discussion, whenever you feel that her  asthma  is stable.  I do not think she requires a followup chest x-ray unless  her symptoms worsen.  I have discussed all this with her and told her  there is about 20% chance of recurrence over the next 2 years.  She  should have repeat chest x-ray done if she develops any symptoms like  this in the future.  There was no evidence that this air has come from  any esophageal problem as she has had no dysphagia and no nausea or  vomiting.      Evelene Croon, M.D.  Electronically Signed     BB/MEDQ  D:  05/26/2007  T:  05/27/2007  Job:  [id number]

## 2010-05-22 NOTE — Discharge Summary (Signed)
[facility]  Patient:    Amber Weber, Amber Weber Visit Number: [id number] MRN: [id number]          Service Type: OBS Location: 910A [id number] Attending Physician:  Enid Cutter Dictated by:   Rick Duff, M.D. Admit Date:  04/20/2001 Discharge Date: 04/23/2001                             Discharge Summary  HISTORY/HOSPITAL COURSE:      This 30 year old primigravida was admitted on March 08, 2001 at 32 5/7 weeks estimated gestational age with a chief complaint of nausea and vomiting, abdominal cramping, and decreased fetal movement. 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  OB ultrasound on the fifth showed umbilical artery and middle cerebral artery Dopplers within normal limits, AFI of 17.6, biophysical profile score of 6/8, and fetus in the 29th percentile with asymmetric growth suggestive of IUGR.  Throughout her hospital course the patients vital signs remained stable and fetal heart tracings reassuring.  She was noted to be GBS positive and received seven days of IV Unasyn.  Fetal fibronectin was positive on March 9 and preterm contractions were treated with terbutaline.  She also received a course of betamethasone.  She was discharged on March 14.  Cervical examination at that time was unchanged from admission at fingertip, 60% effaced, and -2 station. She was placed on bed rest at discharge, counseled against sexual activity, and was to follow up at the high risk clinic. Dictated by:   Rick Duff, M.D. Attending Physician:  Enid Cutter DD:  05/26/01 TD:  05/30/01 Job: [id number]

## 2010-05-22 NOTE — Discharge Summary (Signed)
[name], Amber Weber                ACCOUNT NO.:  [id number]   MEDICAL RECORD NO.:  [id number]          PATIENT TYPE:  INP   LOCATION:  3038                         FACILITY:  [facility]   PHYSICIAN:  Charlcie Cradle. Delford Field, MD, FCCPDATE OF BIRTH:  [birth year]   DATE OF ADMISSION:  03/14/2008  DATE OF DISCHARGE:  03/21/2008                               DISCHARGE SUMMARY   DISCHARGE DIAGNOSES:  1. Spontaneous pneumomediastinum.  2. Severe persistent asthma.   HISTORY OF PRESENT ILLNESS:  See history and physical already in the  chart.   HOSPITAL COURSE:  A 30 year old nonsmoking female with asthma was  admitted with pneumomediastinum.  She has underlying moderate persistent  asthma.  She was treated with Solu-Medrol with a pulse and taper.  She  continued to have difficulty with dysphagia.  GI saw this patient in  consultation who did feel that an upper endoscopy was indicated.  It  showed proximal esophagitis.  Pathology was pending.  She was given  Diflucan.  Her dysphagia did improve.  She was ready for discharge by  March 21, 2008.  She was discharged in improved condition.  She will  return in followup in 2 weeks for outpatient pulmonary followup.  She  was given Protonix orally for reflux and a 4-day of course of Diflucan.  She will maintain her current inhaled medication program as prescribed  and return in followup in the office in 2 weeks.      Charlcie Cradle Delford Field, MD, [facility]  Electronically Signed     PEW/MEDQ  D:  07/11/2008  T:  07/11/2008  Job:  [id number]

## 2010-05-22 NOTE — Discharge Summary (Signed)
Amber Weber, Amber Weber             ACCOUNT NO.:  [id number]   MEDICAL RECORD NO.:  [id number]          PATIENT TYPE:  INP   LOCATION:  9162                          FACILITY:  [facility]   PHYSICIAN:  Tanya S. Shawnie Pons, M.D.   DATE OF BIRTH:  09/19/1980   DATE OF ADMISSION:  06/30/2004  DATE OF DISCHARGE:                                 DISCHARGE SUMMARY   FINAL DIAGNOSES:  1.  Intrauterine pregnancy at 35 and zero-sevenths weeks.  2.  Prodromal versus early labor with bloody show.  3.  Reassuring fetal heart rate.   PERTINENT LABORATORY DATA:  Nonreactive RPR. She had a normal DIC panel.  Hemoglobin was 9.7. Radiology:  She had infant in the vertex presentation,  grade 2 placenta, AFI of 16.3, 2805 g, between the 75th and 90th percentile  for infant. Cervix measured 1.8 transvaginally. No evidence of previa or  other problem with the placenta.   REASON FOR ADMISSION:  Briefly, the patient is a 30 year old gravida 2 para  1 with one previous vaginal delivery at 37 weeks who presented with  contractions and vaginal bleeding, was found to be 1-2, 40%, and high with  some blood in the vault and vaginal discharge. She was admitted, placed on  continuous toco and Unasyn for group B strep prophylaxis.   HOSPITAL COURSE:  The patient was admitted and placed on labor and delivery.  She was monitored for 24 hours with significant decrease in her bleeding. On  rest, her contractions stopped, her bleeding slowed down, and she had no  significant cervical change over 24 hour period. The patient's chart was  reviewed. She was found to be GBS negative on last check, which was in  April. The patient lived close by and was felt to be a good candidate for  discharge because she could come back with worsening labor.   DISCHARGE DISPOSITION AND CONDITION:  The patient discharged home in good  condition.   DISCHARGE INSTRUCTIONS:  1.  Follow up at [facility] within the next week.  2.  Take it easy  for the next 1-2 weeks, to rest as much as possible, to      avoid sexual activity as possible.  3.  The patient is instructed to return with worsening contractions,      increasing bleeding, decreased fetal movement.  4.  She can continue prenatal vitamins but otherwise no new medications are      prescribed.       TSP/MEDQ  D:  07/01/2004  T:  07/01/2004  Job:  [id number]

## 2010-09-30 LAB — COMPREHENSIVE METABOLIC PANEL
ALT: 16
ALT: 18
AST: 15
AST: 18
Albumin: 3.6
Alkaline Phosphatase: 66
Alkaline Phosphatase: 67
BUN: 17
Calcium: 8.6
Chloride: 103
Chloride: 97
Creatinine, Ser: 0.77
GFR calc Af Amer: >60
GFR calc Af Amer: >60
Glucose, Bld: 111 — ABNORMAL HIGH
Glucose, Bld: 118 — ABNORMAL HIGH
Potassium: 4.5
Potassium: 4.8
Sodium: 135
Total Bilirubin: 0.5
Total Bilirubin: 0.5
Total Protein: 5.8 — ABNORMAL LOW
Total Protein: 6.3

## 2010-09-30 LAB — CBC
HCT: 40.8
HCT: 42.9
Hemoglobin: 14.2
Hemoglobin: 14.7
MCHC: 35.3
MCV: 86.7
MCV: 87.3
Platelets: 243
Platelets: 260
Platelets: 262
Platelets: 266
RBC: 4.83
RBC: 4.95
RDW: 12.7
RDW: 13.1
WBC: 13.7 — ABNORMAL HIGH
WBC: 15.1 — ABNORMAL HIGH
WBC: 17.1 — ABNORMAL HIGH

## 2010-09-30 LAB — BASIC METABOLIC PANEL
BUN: 12
BUN: 14
Chloride: 101
Creatinine, Ser: 0.75
GFR calc Af Amer: >60
GFR calc non Af Amer: >60
GFR calc non Af Amer: >60
Potassium: 5
Sodium: 138

## 2010-09-30 LAB — DIFFERENTIAL
Lymphocytes Relative: 13
Lymphs Abs: 2.2
Neutro Abs: 13.4 — ABNORMAL HIGH
Neutrophils Relative %: 78 — ABNORMAL HIGH

## 2010-09-30 LAB — SEDIMENTATION RATE: Sed Rate: 2

## 2010-09-30 LAB — POCT CARDIAC MARKERS
CKMB, poc: <1 — ABNORMAL LOW
Troponin i, poc: <0.05

## 2010-09-30 LAB — POCT I-STAT 3, ART BLOOD GAS (G3+)
Operator id: 284591
pCO2 arterial: 27.2 — ABNORMAL LOW
pH, Arterial: 7.533 — ABNORMAL HIGH
pO2, Arterial: 138 — ABNORMAL HIGH

## 2010-09-30 LAB — D-DIMER, QUANTITATIVE: D-Dimer, Quant: 0.24

## 2010-09-30 LAB — MAGNESIUM: Magnesium: 1.8

## 2010-09-30 LAB — B-NATRIURETIC PEPTIDE (CONVERTED LAB): Pro B Natriuretic peptide (BNP): <30

## 2013-07-09 ENCOUNTER — Emergency Department (HOSPITAL_BASED_OUTPATIENT_CLINIC_OR_DEPARTMENT_OTHER)
Admission: EM | Admit: 2013-07-09 | Discharge: 2013-07-10 | Disposition: A | Discharge status: Home / Self Care | Payer: BC Managed Care – PPO | Attending: Emergency Medicine | Admitting: Emergency Medicine

## 2013-07-09 ENCOUNTER — Encounter (HOSPITAL_BASED_OUTPATIENT_CLINIC_OR_DEPARTMENT_OTHER): Payer: Self-pay | Admitting: Emergency Medicine

## 2013-07-09 DIAGNOSIS — N390 Urinary tract infection, site not specified: Secondary | ICD-10-CM | POA: Insufficient documentation

## 2013-07-09 DIAGNOSIS — Z3202 Encounter for pregnancy test, result negative: Secondary | ICD-10-CM | POA: Insufficient documentation

## 2013-07-09 DIAGNOSIS — Z8709 Personal history of other diseases of the respiratory system: Secondary | ICD-10-CM | POA: Insufficient documentation

## 2013-07-09 HISTORY — DX: Interstitial emphysema: J98.2

## 2013-07-09 LAB — URINALYSIS, ROUTINE W REFLEX MICROSCOPIC
BILIRUBIN URINE: NEGATIVE
GLUCOSE, UA: NEGATIVE mg/dL
Hgb urine dipstick: NEGATIVE
KETONES UR: NEGATIVE mg/dL
NITRITE: NEGATIVE
PH: 7 (ref 5.0–8.0)
PROTEIN: NEGATIVE mg/dL
Specific Gravity, Urine: 1.01 (ref 1.005–1.030)
Urobilinogen, UA: 0.2 mg/dL (ref 0.0–1.0)

## 2013-07-09 LAB — CBC WITH DIFFERENTIAL/PLATELET
Basophils Absolute: 0 10*3/uL (ref 0.0–0.1)
Basophils Relative: 0 % (ref 0–1)
Eosinophils Absolute: 0 10*3/uL (ref 0.0–0.7)
Eosinophils Relative: 0 % (ref 0–5)
HCT: 42.1 % (ref 36.0–46.0)
HEMOGLOBIN: 14.4 g/dL (ref 12.0–15.0)
LYMPHS ABS: 2.9 10*3/uL (ref 0.7–4.0)
LYMPHS PCT: 33 % (ref 12–46)
MCH: 30.1 pg (ref 26.0–34.0)
MCHC: 34.2 g/dL (ref 30.0–36.0)
MCV: 87.9 fL (ref 78.0–100.0)
MONOS PCT: 6 % (ref 3–12)
Monocytes Absolute: 0.5 10*3/uL (ref 0.1–1.0)
NEUTROS ABS: 5.2 10*3/uL (ref 1.7–7.7)
NEUTROS PCT: 60 % (ref 43–77)
PLATELETS: 242 10*3/uL (ref 150–400)
RBC: 4.79 MIL/uL (ref 3.87–5.11)
RDW: 12.2 % (ref 11.5–15.5)
WBC: 8.7 10*3/uL (ref 4.0–10.5)

## 2013-07-09 LAB — COMPREHENSIVE METABOLIC PANEL
ALK PHOS: 60 U/L (ref 39–117)
ALT: 18 U/L (ref 0–35)
ANION GAP: 10 (ref 5–15)
AST: 19 U/L (ref 0–37)
Albumin: 4.4 g/dL (ref 3.5–5.2)
BILIRUBIN TOTAL: 0.5 mg/dL (ref 0.3–1.2)
BUN: 11 mg/dL (ref 6–23)
CHLORIDE: 104 meq/L (ref 96–112)
CO2: 27 meq/L (ref 19–32)
Calcium: 9.4 mg/dL (ref 8.4–10.5)
Creatinine, Ser: 0.6 mg/dL (ref 0.50–1.10)
GLUCOSE: 100 mg/dL — AB (ref 70–99)
POTASSIUM: 4.3 meq/L (ref 3.7–5.3)
SODIUM: 141 meq/L (ref 137–147)
TOTAL PROTEIN: 7.7 g/dL (ref 6.0–8.3)

## 2013-07-09 LAB — PREGNANCY, URINE: Preg Test, Ur: NEGATIVE

## 2013-07-09 LAB — URINE MICROSCOPIC-ADD ON

## 2013-07-09 MED ORDER — HYDROCODONE-ACETAMINOPHEN 5-325 MG PO TABS
2.0000 | ORAL_TABLET | Freq: Once | ORAL | Status: AC
  Administered 2013-07-09: 2 via ORAL
  Filled 2013-07-09: qty 2

## 2013-07-09 MED ORDER — HYDROCODONE-ACETAMINOPHEN 5-325 MG PO TABS: 2.0000 | ORAL_TABLET | ORAL | Status: AC | PRN

## 2013-07-09 MED ORDER — CEPHALEXIN 250 MG PO CAPS
500.0000 mg | ORAL_CAPSULE | Freq: Once | ORAL | Status: AC
  Administered 2013-07-10: 500 mg via ORAL
  Filled 2013-07-09: qty 2

## 2013-07-09 MED ORDER — KETOROLAC TROMETHAMINE 60 MG/2ML IM SOLN
60.0000 mg | Freq: Once | INTRAMUSCULAR | Status: AC
  Administered 2013-07-10: 60 mg via INTRAMUSCULAR
  Filled 2013-07-09: qty 2

## 2013-07-09 MED ORDER — ONDANSETRON 4 MG PO TBDP
4.0000 mg | ORAL_TABLET | Freq: Once | ORAL | Status: AC
  Administered 2013-07-10: 4 mg via ORAL
  Filled 2013-07-09: qty 1

## 2013-07-09 MED ORDER — CEPHALEXIN 500 MG PO CAPS: 500.0000 mg | ORAL_CAPSULE | Freq: Four times a day (QID) | ORAL | Status: AC

## 2013-07-09 NOTE — ED Notes (Signed)
Pt. Reports severe pain in her pelvic region bilat. Hip pain and leg pain bilat. From top to bottom.

## 2013-07-09 NOTE — Discharge Instructions (Signed)

## 2013-07-09 NOTE — ED Provider Notes (Signed)
CSN: [id number]     Arrival date & time 07/09/13  1738 History   First MD Initiated Contact with Patient 07/09/13 2042     Chief Complaint  Patient presents with  . Generalized Body Aches     (Consider location/radiation/quality/duration/timing/severity/associated sxs/prior Treatment) Patient is a 33 y.o. female presenting with leg pain. The history is provided by the patient. No language interpreter was used.  Leg Pain Location:  Leg Injury: no   Leg location:  L upper leg and R upper leg Pain details:    Quality:  Aching   Radiates to:  Does not radiate   Severity:  Moderate   Onset quality:  Gradual   Timing:  Constant Chronicity:  New Dislocation: no   Foreign body present:  No foreign bodies Relieved by:  Nothing Worsened by:  Nothing tried Ineffective treatments:  None tried Associated symptoms: no itching   Risk factors: no concern for non-accidental trauma     Past Medical History  Diagnosis Date  . Pneumomediastinum     several times.   Past Surgical History  Procedure Laterality Date  . Appendectomy    . Ovarian cyst removal     No family history on file. History  Substance Use Topics  . Smoking status: Never Smoker   . Smokeless tobacco: Not on file  . Alcohol Use: No   OB History   Grav Para Term Preterm Abortions TAB SAB Ect Mult Living                 Review of Systems  Skin: Negative for itching.  All other systems reviewed and are negative.     Allergies  Morphine and related  Home Medications   Prior to Admission medications   Not on File   BP 133/81  Pulse 93  Temp(Src) 98.2 F (36.8 C) (Oral)  Resp 18  Ht 5\' 7"  (1.702 m)  Wt 140 lb (63.504 kg)  BMI 21.92 kg/m2  SpO2 100% Physical Exam  Nursing note and vitals reviewed. Constitutional: She is oriented to person, place, and time. She appears well-developed and well-nourished.  HENT:  Head: Normocephalic and atraumatic.  Eyes: Pupils are equal, round, and reactive to  light.  Neck: Normal range of motion.  Cardiovascular: Normal rate.   Pulmonary/Chest: Effort normal.  Abdominal: Soft.  Musculoskeletal: Normal range of motion.  Neurological: She is alert and oriented to person, place, and time. She has normal reflexes.  Skin: Skin is warm.    ED Course  Procedures (including critical care time) Labs Review Labs Reviewed  URINALYSIS, ROUTINE W REFLEX MICROSCOPIC - Abnormal; Notable for the following:    Leukocytes, UA MODERATE (*)    All other components within normal limits  URINE MICROSCOPIC-ADD ON - Abnormal; Notable for the following:    Bacteria, UA MANY (*)    All other components within normal limits  PREGNANCY, URINE    Imaging Review No results found.   EKG Interpretation None      MDM labs normal except urine,   WBC's 7-10  Many bacteria.    Pt given 2 hydrocodone for pain.   Pt given rx for keflex.    I will culture urine.   See Primary Md for recheck in 2 days   Final diagnoses:  UTI (lower urinary tract infection)    Keflex hydrocodone    Elson AreasLeslie K Sofia, PA-C 07/09/13 2339

## 2013-07-10 NOTE — ED Provider Notes (Signed)
Medical screening examination/treatment/procedure(s) were performed by non-physician practitioner and as supervising physician I was immediately available for consultation/collaboration.   EKG Interpretation None       Stevens Magwood, MD 07/10/13 0019 

## 2013-07-11 LAB — URINE CULTURE
CULTURE: NO GROWTH
Colony Count: NO GROWTH

## 2013-12-01 ENCOUNTER — Encounter (HOSPITAL_BASED_OUTPATIENT_CLINIC_OR_DEPARTMENT_OTHER): Payer: Self-pay | Admitting: Emergency Medicine

## 2013-12-01 ENCOUNTER — Emergency Department (HOSPITAL_BASED_OUTPATIENT_CLINIC_OR_DEPARTMENT_OTHER): Payer: BC Managed Care – PPO

## 2013-12-01 ENCOUNTER — Emergency Department (HOSPITAL_BASED_OUTPATIENT_CLINIC_OR_DEPARTMENT_OTHER)
Admission: EM | Admit: 2013-12-01 | Discharge: 2013-12-02 | Disposition: A | Discharge status: Home / Self Care | Payer: BC Managed Care – PPO | Attending: Emergency Medicine | Admitting: Emergency Medicine

## 2013-12-01 DIAGNOSIS — W19XXXA Unspecified fall, initial encounter: Secondary | ICD-10-CM

## 2013-12-01 DIAGNOSIS — Y9289 Other specified places as the place of occurrence of the external cause: Secondary | ICD-10-CM | POA: Insufficient documentation

## 2013-12-01 DIAGNOSIS — Y9389 Activity, other specified: Secondary | ICD-10-CM | POA: Diagnosis not present

## 2013-12-01 DIAGNOSIS — R110 Nausea: Secondary | ICD-10-CM | POA: Diagnosis not present

## 2013-12-01 DIAGNOSIS — S9032XA Contusion of left foot, initial encounter: Secondary | ICD-10-CM | POA: Diagnosis not present

## 2013-12-01 DIAGNOSIS — S7002XA Contusion of left hip, initial encounter: Secondary | ICD-10-CM | POA: Insufficient documentation

## 2013-12-01 DIAGNOSIS — S29001A Unspecified injury of muscle and tendon of front wall of thorax, initial encounter: Secondary | ICD-10-CM | POA: Diagnosis present

## 2013-12-01 DIAGNOSIS — W11XXXA Fall on and from ladder, initial encounter: Secondary | ICD-10-CM | POA: Diagnosis not present

## 2013-12-01 DIAGNOSIS — S6992XA Unspecified injury of left wrist, hand and finger(s), initial encounter: Secondary | ICD-10-CM | POA: Diagnosis not present

## 2013-12-01 DIAGNOSIS — Z79899 Other long term (current) drug therapy: Secondary | ICD-10-CM | POA: Insufficient documentation

## 2013-12-01 DIAGNOSIS — S20212A Contusion of left front wall of thorax, initial encounter: Secondary | ICD-10-CM | POA: Diagnosis not present

## 2013-12-01 DIAGNOSIS — S40012A Contusion of left shoulder, initial encounter: Secondary | ICD-10-CM | POA: Diagnosis not present

## 2013-12-01 DIAGNOSIS — S99919A Unspecified injury of unspecified ankle, initial encounter: Secondary | ICD-10-CM | POA: Diagnosis not present

## 2013-12-01 DIAGNOSIS — R079 Chest pain, unspecified: Secondary | ICD-10-CM | POA: Diagnosis not present

## 2013-12-01 DIAGNOSIS — R42 Dizziness and giddiness: Secondary | ICD-10-CM | POA: Insufficient documentation

## 2013-12-01 DIAGNOSIS — Z792 Long term (current) use of antibiotics: Secondary | ICD-10-CM | POA: Insufficient documentation

## 2013-12-01 DIAGNOSIS — Y998 Other external cause status: Secondary | ICD-10-CM | POA: Insufficient documentation

## 2013-12-01 DIAGNOSIS — S3991XA Unspecified injury of abdomen, initial encounter: Secondary | ICD-10-CM | POA: Diagnosis not present

## 2013-12-01 NOTE — ED Notes (Addendum)
Pt presents to ED with complaints of left sided pain after falling landing on hardwood.  Pt reports left arm left hand left hip nausea and dizziness and pain when breathing.  Pt was almost at the top of a 6 foot later when later collapsed and she fell to the floor.  PT doesn't know if she hit head or not.

## 2013-12-02 ENCOUNTER — Emergency Department (HOSPITAL_BASED_OUTPATIENT_CLINIC_OR_DEPARTMENT_OTHER): Payer: BC Managed Care – PPO

## 2013-12-02 LAB — CBC WITH DIFFERENTIAL/PLATELET
BASOS ABS: 0.1 10*3/uL (ref 0.0–0.1)
Basophils Relative: 1 % (ref 0–1)
EOS PCT: 1 % (ref 0–5)
Eosinophils Absolute: 0.1 10*3/uL (ref 0.0–0.7)
HCT: 37.4 % (ref 36.0–46.0)
Hemoglobin: 12.8 g/dL (ref 12.0–15.0)
LYMPHS ABS: 3.1 10*3/uL (ref 0.7–4.0)
LYMPHS PCT: 40 % (ref 12–46)
MCH: 30.3 pg (ref 26.0–34.0)
MCHC: 34.2 g/dL (ref 30.0–36.0)
MCV: 88.6 fL (ref 78.0–100.0)
Monocytes Absolute: 0.7 10*3/uL (ref 0.1–1.0)
Monocytes Relative: 9 % (ref 3–12)
Neutro Abs: 3.9 10*3/uL (ref 1.7–7.7)
Neutrophils Relative %: 49 % (ref 43–77)
Platelets: 169 10*3/uL (ref 150–400)
RBC: 4.22 MIL/uL (ref 3.87–5.11)
RDW: 12.2 % (ref 11.5–15.5)
WBC: 7.7 10*3/uL (ref 4.0–10.5)

## 2013-12-02 LAB — BASIC METABOLIC PANEL
Anion gap: 12 (ref 5–15)
BUN: 12 mg/dL (ref 6–23)
CALCIUM: 8.9 mg/dL (ref 8.4–10.5)
CO2: 23 meq/L (ref 19–32)
CREATININE: 0.6 mg/dL (ref 0.50–1.10)
Chloride: 105 mEq/L (ref 96–112)
GFR calc Af Amer: >90 mL/min (ref >90)
GFR calc non Af Amer: >90 mL/min (ref >90)
GLUCOSE: 93 mg/dL (ref 70–99)
Potassium: 3.9 mEq/L (ref 3.7–5.3)
Sodium: 140 mEq/L (ref 137–147)

## 2013-12-02 MED ORDER — ONDANSETRON HCL 4 MG/2ML IJ SOLN
4.0000 mg | Freq: Once | INTRAMUSCULAR | Status: AC
  Administered 2013-12-02: 4 mg via INTRAVENOUS
  Filled 2013-12-02: qty 2

## 2013-12-02 MED ORDER — SODIUM CHLORIDE 0.9 % IV SOLN
Freq: Once | INTRAVENOUS | Status: AC
  Administered 2013-12-02: 01:00:00 via INTRAVENOUS

## 2013-12-02 MED ORDER — HYDROMORPHONE HCL 1 MG/ML IJ SOLN
1.0000 mg | Freq: Once | INTRAMUSCULAR | Status: AC
  Administered 2013-12-02: 1 mg via INTRAVENOUS
  Filled 2013-12-02: qty 1

## 2013-12-02 MED ORDER — IOHEXOL 300 MG/ML  SOLN
100.0000 mL | Freq: Once | INTRAMUSCULAR | Status: AC | PRN
  Administered 2013-12-02: 100 mL via INTRAVENOUS

## 2013-12-02 MED ORDER — OXYCODONE-ACETAMINOPHEN 5-325 MG PO TABS: 1.0000 | ORAL_TABLET | ORAL | Status: AC | PRN

## 2013-12-02 MED ORDER — OXYCODONE-ACETAMINOPHEN 5-325 MG PO TABS: 1.0000 | ORAL_TABLET | ORAL | Status: DC | PRN

## 2013-12-02 NOTE — Discharge Instructions (Signed)
Take ibuprofen, naproxen, or acetaminophen as needed for less severe pain.  Contusion A contusion is a deep bruise. Contusions are the result of an injury that caused bleeding under the skin. The contusion may turn blue, purple, or yellow. Minor injuries will give you a painless contusion, but more severe contusions may stay painful and swollen for a few weeks.  CAUSES  A contusion is usually caused by a blow, trauma, or direct force to an area of the body. SYMPTOMS   Swelling and redness of the injured area.  Bruising of the injured area.  Tenderness and soreness of the injured area.  Pain. DIAGNOSIS  The diagnosis can be made by taking a history and physical exam. An X-ray, CT scan, or MRI may be needed to determine if there were any associated injuries, such as fractures. TREATMENT  Specific treatment will depend on what area of the body was injured. In general, the best treatment for a contusion is resting, icing, elevating, and applying cold compresses to the injured area. Over-the-counter medicines may also be recommended for pain control. Ask your caregiver what the best treatment is for your contusion. HOME CARE INSTRUCTIONS   Put ice on the injured area.  Put ice in a plastic bag.  Place a towel between your skin and the bag.  Leave the ice on for 15-20 minutes, 3-4 times a day, or as directed by your health care provider.  Only take over-the-counter or prescription medicines for pain, discomfort, or fever as directed by your caregiver. Your caregiver may recommend avoiding anti-inflammatory medicines (aspirin, ibuprofen, and naproxen) for 48 hours because these medicines may increase bruising.  Rest the injured area.  If possible, elevate the injured area to reduce swelling. SEEK IMMEDIATE MEDICAL CARE IF:   You have increased bruising or swelling.  You have pain that is getting worse.  Your swelling or pain is not relieved with medicines. MAKE SURE YOU:    Understand these instructions.  Will watch your condition.  Will get help right away if you are not doing well or get worse. Document Released: 09/30/2004 Document Revised: 12/26/2012 Document Reviewed: 10/26/2010 [facility] Patient Information 2015 [place], [place]. This information is not intended to replace advice given to you by your health care provider. Make sure you discuss any questions you have with your health care provider.  Fall Prevention and Home Safety Falls cause injuries and can affect all age groups. It is possible to use preventive measures to significantly decrease the likelihood of falls. There are many simple measures which can make your home safer and prevent falls. OUTDOORS  Repair cracks and edges of walkways and driveways.  Remove high doorway thresholds.  Trim shrubbery on the main path into your home.  Have good outside lighting.  Clear walkways of tools, rocks, debris, and clutter.  Check that handrails are not broken and are securely fastened. Both sides of steps should have handrails.  Have leaves, snow, and ice cleared regularly.  Use sand or salt on walkways during winter months.  In the garage, clean up grease or oil spills. BATHROOM  Install night lights.  Install grab bars by the toilet and in the tub and shower.  Use non-skid mats or decals in the tub or shower.  Place a plastic non-slip stool in the shower to sit on, if needed.  Keep floors dry and clean up all water on the floor immediately.  Remove soap buildup in the tub or shower on a regular basis.  Secure bath  mats with non-slip, double-sided rug tape.  Remove throw rugs and tripping hazards from the floors. BEDROOMS  Install night lights.  Make sure a bedside light is easy to reach.  Do not use oversized bedding.  Keep a telephone by your bedside.  Have a firm chair with side arms to use for getting dressed.  Remove throw rugs and tripping hazards from the  floor. KITCHEN  Keep handles on pots and pans turned toward the center of the stove. Use back burners when possible.  Clean up spills quickly and allow time for drying.  Avoid walking on wet floors.  Avoid hot utensils and knives.  Position shelves so they are not too high or low.  Place commonly used objects within easy reach.  If necessary, use a sturdy step stool with a grab bar when reaching.  Keep electrical cables out of the way.  Do not use floor polish or wax that makes floors slippery. If you must use wax, use non-skid floor wax.  Remove throw rugs and tripping hazards from the floor. STAIRWAYS  Never leave objects on stairs.  Place handrails on both sides of stairways and use them. Fix any loose handrails. Make sure handrails on both sides of the stairways are as long as the stairs.  Check carpeting to make sure it is firmly attached along stairs. Make repairs to worn or loose carpet promptly.  Avoid placing throw rugs at the top or bottom of stairways, or properly secure the rug with carpet tape to prevent slippage. Get rid of throw rugs, if possible.  Have an electrician put in a light switch at the top and bottom of the stairs. OTHER FALL PREVENTION TIPS  Wear low-heel or rubber-soled shoes that are supportive and fit well. Wear closed toe shoes.  When using a stepladder, make sure it is fully opened and both spreaders are firmly locked. Do not climb a closed stepladder.  Add color or contrast paint or tape to grab bars and handrails in your home. Place contrasting color strips on first and last steps.  Learn and use mobility aids as needed. Install an electrical emergency response system.  Turn on lights to avoid dark areas. Replace light bulbs that burn out immediately. Get light switches that glow.  Arrange furniture to create clear pathways. Keep furniture in the same place.  Firmly attach carpet with non-skid or double-sided tape.  Eliminate uneven  floor surfaces.  Select a carpet pattern that does not visually hide the edge of steps.  Be aware of all pets. OTHER HOME SAFETY TIPS  Set the water temperature for 120 F (48.8 C).  Keep emergency numbers on or near the telephone.  Keep smoke detectors on every level of the home and near sleeping areas. Document Released: 12/11/2001 Document Revised: 06/22/2011 Document Reviewed: 03/12/2011 [facility] Patient Information 2015 [place]. This information is not intended to replace advice given to you by your health care provider. Make sure you discuss any questions you have with your health care provider.  Acetaminophen; Oxycodone tablets What is this medicine? ACETAMINOPHEN; OXYCODONE (a set a MEE noe fen; ox i KOE done) is a pain reliever. It is used to treat mild to moderate pain. This medicine may be used for other purposes; ask your health care provider or pharmacist if you have questions. COMMON BRAND NAME(S): Endocet, Magnacet, Narvox, Percocet, Perloxx, Primalev, Primlev, Roxicet, Xolox What should I tell my health care provider before I take this medicine? They need to know if you  have any of these conditions: -brain tumor -Crohn's disease, inflammatory bowel disease, or ulcerative colitis -drug abuse or addiction -head injury -heart or circulation problems -if you often drink alcohol -kidney disease or problems going to the bathroom -liver disease -lung disease, asthma, or breathing problems -an unusual or allergic reaction to acetaminophen, oxycodone, other opioid analgesics, other medicines, foods, dyes, or preservatives -pregnant or trying to get pregnant -breast-feeding How should I use this medicine? Take this medicine by mouth with a full glass of water. Follow the directions on the prescription label. Take your medicine at regular intervals. Do not take your medicine more often than directed. Talk to your pediatrician regarding the use of this medicine in  children. Special care may be needed. Patients over 32 years old may have a stronger reaction and need a smaller dose. Overdosage: If you think you have taken too much of this medicine contact a poison control center or emergency room at once. NOTE: This medicine is only for you. Do not share this medicine with others. What if I miss a dose? If you miss a dose, take it as soon as you can. If it is almost time for your next dose, take only that dose. Do not take double or extra doses. What may interact with this medicine? -alcohol -antihistamines -barbiturates like amobarbital, butalbital, butabarbital, methohexital, pentobarbital, phenobarbital, thiopental, and secobarbital -benztropine -drugs for bladder problems like solifenacin, trospium, oxybutynin, tolterodine, hyoscyamine, and methscopolamine -drugs for breathing problems like ipratropium and tiotropium -drugs for certain stomach or intestine problems like propantheline, homatropine methylbromide, glycopyrrolate, atropine, belladonna, and dicyclomine -general anesthetics like etomidate, ketamine, nitrous oxide, propofol, desflurane, enflurane, halothane, isoflurane, and sevoflurane -medicines for depression, anxiety, or psychotic disturbances -medicines for sleep -muscle relaxants -naltrexone -narcotic medicines (opiates) for pain -phenothiazines like perphenazine, thioridazine, chlorpromazine, mesoridazine, fluphenazine, prochlorperazine, promazine, and trifluoperazine -scopolamine -tramadol -trihexyphenidyl This list may not describe all possible interactions. Give your health care provider a list of all the medicines, herbs, non-prescription drugs, or dietary supplements you use. Also tell them if you smoke, drink alcohol, or use illegal drugs. Some items may interact with your medicine. What should I watch for while using this medicine? Tell your doctor or health care professional if your pain does not go away, if it gets worse,  or if you have new or a different type of pain. You may develop tolerance to the medicine. Tolerance means that you will need a higher dose of the medication for pain relief. Tolerance is normal and is expected if you take this medicine for a long time. Do not suddenly stop taking your medicine because you may develop a severe reaction. Your body becomes used to the medicine. This does NOT mean you are addicted. Addiction is a behavior related to getting and using a drug for a non-medical reason. If you have pain, you have a medical reason to take pain medicine. Your doctor will tell you how much medicine to take. If your doctor wants you to stop the medicine, the dose will be slowly lowered over time to avoid any side effects. You may get drowsy or dizzy. Do not drive, use machinery, or do anything that needs mental alertness until you know how this medicine affects you. Do not stand or sit up quickly, especially if you are an older patient. This reduces the risk of dizzy or fainting spells. Alcohol may interfere with the effect of this medicine. Avoid alcoholic drinks. There are different types of narcotic medicines (opiates) for pain. If  you take more than one type at the same time, you may have more side effects. Give your health care provider a list of all medicines you use. Your doctor will tell you how much medicine to take. Do not take more medicine than directed. Call emergency for help if you have problems breathing. The medicine will cause constipation. Try to have a bowel movement at least every 2 to 3 days. If you do not have a bowel movement for 3 days, call your doctor or health care professional. Do not take Tylenol (acetaminophen) or medicines that have acetaminophen with this medicine. Too much acetaminophen can be very dangerous. Many nonprescription medicines contain acetaminophen. Always read the labels carefully to avoid taking more acetaminophen. What side effects may I notice from  receiving this medicine? Side effects that you should report to your doctor or health care professional as soon as possible: -allergic reactions like skin rash, itching or hives, swelling of the face, lips, or tongue -breathing difficulties, wheezing -confusion -light headedness or fainting spells -severe stomach pain -unusually weak or tired -yellowing of the skin or the whites of the eyes Side effects that usually do not require medical attention (report to your doctor or health care professional if they continue or are bothersome): -dizziness -drowsiness -nausea -vomiting This list may not describe all possible side effects. Call your doctor for medical advice about side effects. You may report side effects to FDA at 1-800-FDA-1088. Where should I keep my medicine? Keep out of the reach of children. This medicine can be abused. Keep your medicine in a safe place to protect it from theft. Do not share this medicine with anyone. Selling or giving away this medicine is dangerous and against the law. Store at room temperature between 20 and 25 degrees C (68 and 77 degrees F). Keep container tightly closed. Protect from light. This medicine may cause accidental overdose and death if it is taken by other adults, children, or pets. Flush any unused medicine down the toilet to reduce the chance of harm. Do not use the medicine after the expiration date. NOTE: This sheet is a summary. It may not cover all possible information. If you have questions about this medicine, talk to your doctor, pharmacist, or health care provider.  2015, [employer]. (2012-08-14 13:17:35)

## 2013-12-02 NOTE — ED Provider Notes (Signed)
CSN: [id number]     Arrival date & time 12/01/13  2226 History  This chart was scribed for Amber Boozeavid Reyonna Haack, MD by Gwenyth Oberatherine Macek, ED Scribe. This patient was seen in room MH10/MH10 and the patient's care was started at 12:32 AM.   Chief Complaint  Patient presents with  . Fall   The history is provided by the patient. No language interpreter was used.    HPI Comments: Amber Weber is a 33 y.o. female who presents to the Emergency Department complaining of constant, 8/10, left hand, left flank, left rib cage and left toe pain after falling 7-8 feet down folding stairs earlier today. Pt notes nausea and dizziness as associated symptoms. She does not know if she hit her head during the fall. Pt states morphine gave her one occurrence of rash after 3 weeks of administration. She denies any other injuries.  [facility]  Past Medical History  Diagnosis Date  . Pneumomediastinum     several times.   Past Surgical History  Procedure Laterality Date  . Appendectomy    . Ovarian cyst removal     No family history on file. History  Substance Use Topics  . Smoking status: Never Smoker   . Smokeless tobacco: Not on file  . Alcohol Use: No   OB History    No data available     Review of Systems  Gastrointestinal: Positive for nausea.  Musculoskeletal: Positive for arthralgias.  Neurological: Positive for dizziness.  All other systems reviewed and are negative.   Allergies  Morphine and related  Home Medications   Prior to Admission medications   Medication Sig Start Date End Date Taking? Authorizing Provider  cephALEXin (KEFLEX) 500 MG capsule Take 1 capsule (500 mg total) by mouth 4 (four) times daily. 07/09/13   Elson AreasLeslie K Sofia, PA-C  HYDROcodone-acetaminophen (NORCO/VICODIN) 5-325 MG per tablet Take 2 tablets by mouth every 4 (four) hours as needed. 07/09/13   Lonia SkinnerLeslie K Sofia, PA-C   BP 144/102 mmHg  Pulse 119  Temp(Src) 98.2 F (36.8 C) (Oral)  Resp 20  Ht 5'  6" (1.676 m)  Wt 145 lb (65.772 kg)  BMI 23.41 kg/m2  SpO2 100% Physical Exam  Constitutional: She is oriented to person, place, and time. She appears well-developed and well-nourished. No distress.  HENT:  Head: Normocephalic and atraumatic.  Eyes: Conjunctivae and EOM are normal. Pupils are equal, round, and reactive to light.  Neck: Normal range of motion. Neck supple. No JVD present. No tracheal deviation present.  Mild tenderness diffusely  Cardiovascular: Normal rate, regular rhythm and normal heart sounds.   No murmur heard. Pulmonary/Chest: Effort normal and breath sounds normal. No respiratory distress. She has no wheezes. She has no rales. She exhibits tenderness.  Moderate tenderness left posterior and lateral rib cage. No crepitus.   Abdominal: Soft. She exhibits no mass. There is tenderness. There is no rebound and no guarding.  Moderate tenderness left mid and upper abdomen.  Musculoskeletal: She exhibits tenderness. She exhibits no edema.  Marked tenderness left shoulder, ulnar aspect of left hand, left hip and medial aspect of left foot. Pain on passive ROM left shoulder, hip and ankle. Neurovascularly intact.  Lymphadenopathy:    She has no cervical adenopathy.  Neurological: She is alert and oriented to person, place, and time. She has normal reflexes. No cranial nerve deficit. Coordination normal.  Skin: Skin is warm and dry. No rash noted.  Psychiatric: She has a normal mood  and affect. Her behavior is normal. Thought content normal.  Nursing note and vitals reviewed.   ED Course  Procedures (including critical care time) DIAGNOSTIC STUDIES: Oxygen Saturation is 100% on RA, normal by my interpretation.    COORDINATION OF CARE: 12:42 AM Discussed treatment plan with pt which includes CT abdomen pelvis, CT head, CT cervical spine, and CT chest. Pt agreed to plan.  Labs Review Results for orders placed or performed during the hospital encounter of 12/01/13  CBC  with Differential  Result Value Ref Range   WBC 7.7 4.0 - 10.5 K/uL   RBC 4.22 3.87 - 5.11 MIL/uL   Hemoglobin 12.8 12.0 - 15.0 g/dL   HCT 81.137.4 91.436.0 - 78.246.0 %   MCV 88.6 78.0 - 100.0 fL   MCH 30.3 26.0 - 34.0 pg   MCHC 34.2 30.0 - 36.0 g/dL   RDW 95.612.2 21.311.5 - 08.615.5 %   Platelets 169 150 - 400 K/uL   Neutrophils Relative % 49 43 - 77 %   Neutro Abs 3.9 1.7 - 7.7 K/uL   Lymphocytes Relative 40 12 - 46 %   Lymphs Abs 3.1 0.7 - 4.0 K/uL   Monocytes Relative 9 3 - 12 %   Monocytes Absolute 0.7 0.1 - 1.0 K/uL   Eosinophils Relative 1 0 - 5 %   Eosinophils Absolute 0.1 0.0 - 0.7 K/uL   Basophils Relative 1 0 - 1 %   Basophils Absolute 0.1 0.0 - 0.1 K/uL  Basic metabolic panel  Result Value Ref Range   Sodium 140 137 - 147 mEq/L   Potassium 3.9 3.7 - 5.3 mEq/L   Chloride 105 96 - 112 mEq/L   CO2 23 19 - 32 mEq/L   Glucose, Bld 93 70 - 99 mg/dL   BUN 12 6 - 23 mg/dL   Creatinine, Ser 5.780.60 0.50 - 1.10 mg/dL   Calcium 8.9 8.4 - 46.910.5 mg/dL   GFR calc non Af Amer >90 >90 mL/min   GFR calc Af Amer >90 >90 mL/min   Anion gap 12 5 - 15    Imaging Review Dg Ribs Unilateral W/chest Left  12/01/2013   CLINICAL DATA:  LEFT-sided pain after a fall on hardwood floor from 6 foot ladder. Dizziness and nausea.  EXAM: LEFT RIBS AND CHEST - 3+ VIEW  COMPARISON:  CT of the chest March 13, 2010  FINDINGS: No fracture or other bone lesions are seen involving the ribs. There is no evidence of pneumothorax or pleural effusion. Both lungs are clear. Heart size and mediastinal contours are within normal limits.  IMPRESSION: Negative.   Electronically Signed   By: Awilda Metroourtnay  Bloomer   On: 12/01/2013 23:30   Dg Elbow Complete Left  12/01/2013   CLINICAL DATA:  Status post fall six feet off ladder. Landed on hardwood floor. Left-sided elbow pain. Initial encounter.  EXAM: LEFT ELBOW - COMPLETE 3+ VIEW  COMPARISON:  None.  FINDINGS: There is no evidence of fracture or dislocation. The visualized joint spaces are  preserved. No significant joint effusion is identified. The soft tissues are unremarkable in appearance.  IMPRESSION: No evidence of fracture or dislocation.   Electronically Signed   By: Roanna RaiderJeffery  Chang M.D.   On: 12/01/2013 23:23   Dg Hip Complete Left  12/01/2013   CLINICAL DATA:  33 year old female with left-sided pain after falling off of a ladder  EXAM: LEFT HIP - COMPLETE 2+ VIEW  COMPARISON:  None  FINDINGS: No evidence of acute fracture or  malalignment. Normal bony mineralization. No lytic or blastic osseous lesion. IUD in the expected location overlying anatomic pelvis. The femoral head is located.  IMPRESSION: Negative.   Electronically Signed   By: Malachy Moan M.D.   On: 12/01/2013 23:44   Ct Head Wo Contrast  12/02/2013   CLINICAL DATA:  Larey Seat 8 feet down stairs from attic today. Possible head injury. Nausea and dizziness.  EXAM: CT HEAD WITHOUT CONTRAST  CT CERVICAL SPINE WITHOUT CONTRAST  TECHNIQUE: Multidetector CT imaging of the head and cervical spine was performed following the standard protocol without intravenous contrast. Multiplanar CT image reconstructions of the cervical spine were also generated.  COMPARISON:  None.  FINDINGS: CT HEAD FINDINGS  The ventricles and sulci are normal. No intraparenchymal hemorrhage, mass effect nor midline shift. No acute large vascular territory infarcts.  No abnormal extra-axial fluid collections. Basal cisterns are patent.  No skull fracture. The included ocular globes and orbital contents are non-suspicious. The mastoid aircells and included paranasal sinuses are well-aerated.  CT CERVICAL SPINE FINDINGS  Cervical vertebral bodies and posterior elements are intact and aligned with broad reversed cervical lordosis. Intervertebral disc heights preserved. No destructive bony lesions. C1-2 articulation maintained. Included prevertebral and paraspinal soft tissues are unremarkable.  IMPRESSION: CT HEAD: No acute intracranial process ; normal  noncontrast CT of the head.  CT CERVICAL SPINE: Broad reversed cervical lordosis without acute fracture or malalignment.   Electronically Signed   By: Awilda Metro   On: 12/02/2013 02:39   Ct Chest W Contrast  12/02/2013   CLINICAL DATA:  Status post fall down 7-8 folding stairs of attic earlier today, with acute onset of nausea and dizziness. Patient does not know if she hit her head; left flank and left chest pain. Initial encounter.  EXAM: CT CHEST, ABDOMEN, AND PELVIS WITH CONTRAST  TECHNIQUE: Multidetector CT imaging of the chest, abdomen and pelvis was performed following the standard protocol during bolus administration of intravenous contrast.  CONTRAST:  OMNIPAQUE IOHEXOL 300 MG/ML  SOLN  COMPARISON:  CTA of the chest performed 03/13/2010  FINDINGS: CT CHEST FINDINGS  Minimal bilateral atelectasis is noted. The lungs are otherwise clear. There is no evidence of focal consolidation, pleural effusion or pneumothorax. No pulmonary parenchymal contusion is seen. No mass is identified.  The mediastinum is unremarkable in appearance. There is no evidence of mediastinal lymphadenopathy. No pericardial effusion is identified. The great vessels are grossly unremarkable in appearance. There is no evidence of venous hemorrhage. The thyroid gland is unremarkable. No axillary lymphadenopathy is seen.  There is no evidence of significant soft tissue injury along the chest wall.  No acute osseous abnormalities are seen.  CT ABDOMEN AND PELVIS FINDINGS  No free air or free fluid is seen within the abdomen or pelvis. There is no evidence of solid or hollow organ injury.  The liver and spleen are unremarkable in appearance. The gallbladder is within normal limits. The pancreas and adrenal glands are unremarkable.  Mild scarring is noted with regard to the left kidney. The kidneys are otherwise unremarkable. There is no evidence of hydronephrosis. No renal or ureteral stones are seen. No perinephric stranding  is appreciated.  The small bowel is unremarkable in appearance. The stomach is within normal limits. No acute vascular abnormalities are seen.  The patient is status post appendectomy. The colon is unremarkable in appearance.  The bladder is mildly distended and grossly unremarkable. The uterus is within normal limits. The patient's intrauterine device is noted  in expected position, at the fundus of the uterus. The ovaries are relatively symmetric. No suspicious adnexal masses are seen. No inguinal lymphadenopathy is seen.  No acute osseous abnormalities are identified.  IMPRESSION: 1. No evidence of traumatic injury to the chest, abdomen or pelvis. 2. Minimal bilateral atelectasis noted; lungs otherwise clear.   Electronically Signed   By: Roanna Raider M.D.   On: 12/02/2013 02:53   Ct Cervical Spine Wo Contrast  12/02/2013   CLINICAL DATA:  Larey Seat 8 feet down stairs from attic today. Possible head injury. Nausea and dizziness.  EXAM: CT HEAD WITHOUT CONTRAST  CT CERVICAL SPINE WITHOUT CONTRAST  TECHNIQUE: Multidetector CT imaging of the head and cervical spine was performed following the standard protocol without intravenous contrast. Multiplanar CT image reconstructions of the cervical spine were also generated.  COMPARISON:  None.  FINDINGS: CT HEAD FINDINGS  The ventricles and sulci are normal. No intraparenchymal hemorrhage, mass effect nor midline shift. No acute large vascular territory infarcts.  No abnormal extra-axial fluid collections. Basal cisterns are patent.  No skull fracture. The included ocular globes and orbital contents are non-suspicious. The mastoid aircells and included paranasal sinuses are well-aerated.  CT CERVICAL SPINE FINDINGS  Cervical vertebral bodies and posterior elements are intact and aligned with broad reversed cervical lordosis. Intervertebral disc heights preserved. No destructive bony lesions. C1-2 articulation maintained. Included prevertebral and paraspinal soft tissues  are unremarkable.  IMPRESSION: CT HEAD: No acute intracranial process ; normal noncontrast CT of the head.  CT CERVICAL SPINE: Broad reversed cervical lordosis without acute fracture or malalignment.   Electronically Signed   By: Awilda Metro   On: 12/02/2013 02:39   Ct Abdomen Pelvis W Contrast  12/02/2013   CLINICAL DATA:  Status post fall down 7-8 folding stairs of attic earlier today, with acute onset of nausea and dizziness. Patient does not know if she hit her head; left flank and left chest pain. Initial encounter.  EXAM: CT CHEST, ABDOMEN, AND PELVIS WITH CONTRAST  TECHNIQUE: Multidetector CT imaging of the chest, abdomen and pelvis was performed following the standard protocol during bolus administration of intravenous contrast.  CONTRAST:  OMNIPAQUE IOHEXOL 300 MG/ML  SOLN  COMPARISON:  CTA of the chest performed 03/13/2010  FINDINGS: CT CHEST FINDINGS  Minimal bilateral atelectasis is noted. The lungs are otherwise clear. There is no evidence of focal consolidation, pleural effusion or pneumothorax. No pulmonary parenchymal contusion is seen. No mass is identified.  The mediastinum is unremarkable in appearance. There is no evidence of mediastinal lymphadenopathy. No pericardial effusion is identified. The great vessels are grossly unremarkable in appearance. There is no evidence of venous hemorrhage. The thyroid gland is unremarkable. No axillary lymphadenopathy is seen.  There is no evidence of significant soft tissue injury along the chest wall.  No acute osseous abnormalities are seen.  CT ABDOMEN AND PELVIS FINDINGS  No free air or free fluid is seen within the abdomen or pelvis. There is no evidence of solid or hollow organ injury.  The liver and spleen are unremarkable in appearance. The gallbladder is within normal limits. The pancreas and adrenal glands are unremarkable.  Mild scarring is noted with regard to the left kidney. The kidneys are otherwise unremarkable. There is no  evidence of hydronephrosis. No renal or ureteral stones are seen. No perinephric stranding is appreciated.  The small bowel is unremarkable in appearance. The stomach is within normal limits. No acute vascular abnormalities are seen.  The patient is  status post appendectomy. The colon is unremarkable in appearance.  The bladder is mildly distended and grossly unremarkable. The uterus is within normal limits. The patient's intrauterine device is noted in expected position, at the fundus of the uterus. The ovaries are relatively symmetric. No suspicious adnexal masses are seen. No inguinal lymphadenopathy is seen.  No acute osseous abnormalities are identified.  IMPRESSION: 1. No evidence of traumatic injury to the chest, abdomen or pelvis. 2. Minimal bilateral atelectasis noted; lungs otherwise clear.   Electronically Signed   By: Roanna Raider M.D.   On: 12/02/2013 02:53   Dg Shoulder Left  12/01/2013   CLINICAL DATA:  Status post fall six feet off ladder. Landed on hardwood floor. Left-sided shoulder pain. Initial encounter.  EXAM: LEFT SHOULDER - 2+ VIEW  COMPARISON:  None.  FINDINGS: There is no evidence of fracture or dislocation. The left humeral head is seated within the glenoid fossa. The acromioclavicular joint is unremarkable in appearance. No significant soft tissue abnormalities are seen. The visualized portions of the left lung are clear.  IMPRESSION: No evidence of fracture or dislocation.   Electronically Signed   By: Roanna Raider M.D.   On: 12/01/2013 23:22   Dg Hand Complete Left  12/01/2013   CLINICAL DATA:  33 year old female with left hand pain after falling 6 feet onto wood floor  EXAM: LEFT HAND - COMPLETE 3+ VIEW  COMPARISON:  Concurrently obtained radiographs of the left shoulder, left elbow and left ribs  FINDINGS: No acute fracture or malalignment. Normal bony mineralization. No lytic of blastic osseous lesion. No soft tissue abnormality.  IMPRESSION: Negative.   Electronically  Signed   By: Malachy Moan M.D.   On: 12/01/2013 23:24   Dg Foot Complete Left  12/01/2013   CLINICAL DATA:  LEFT-sided pain after a fall on hardwood floor from 6 foot ladder. Dizziness and nausea. Foot pain.  EXAM: LEFT FOOT - COMPLETE 3+ VIEW  COMPARISON:  None.  FINDINGS: There is no evidence of fracture or dislocation. There is no evidence of arthropathy or other focal bone abnormality. Type 2 navicular bone. Small os perineum. Bipartite metatarsal tibial sesamoid. Soft tissues are unremarkable.  IMPRESSION: Negative.   Electronically Signed   By: Awilda Metro   On: 12/01/2013 23:34    MDM   Final diagnoses:  Fall from ladder, initial encounter  Contusion of left shoulder, initial encounter  Contusion of left chest wall, initial encounter  Contusion of left hip, initial encounter  Contusion of left foot, initial encounter    Multiple contusions secondary to fall down folding stairs going into attic. Patient is having considerable pain but no obvious deformities are seen. Initial x-rays show no bony injury but there is significant chest tenderness and left upper quadrant tenderness. There is also some dizziness and nausea concerning for possible concussion. She will be sent back for CT scans.  CTs show no evidence of acute injury. She did get adequate pain relief with hydromorphone in the ED. She is discharged with prescription for oxycodone-acetaminophen and is instructed to follow-up with PCP.   I personally performed the services described in this documentation, which was scribed in my presence. The recorded information has been reviewed and is accurate.      Amber Booze, MD 12/02/13 [phone]

## 2018-02-18 ENCOUNTER — Emergency Department (HOSPITAL_BASED_OUTPATIENT_CLINIC_OR_DEPARTMENT_OTHER): Payer: BC Managed Care – PPO

## 2018-02-18 ENCOUNTER — Encounter (HOSPITAL_BASED_OUTPATIENT_CLINIC_OR_DEPARTMENT_OTHER): Payer: Self-pay | Admitting: Emergency Medicine

## 2018-02-18 ENCOUNTER — Other Ambulatory Visit: Payer: Self-pay

## 2018-02-18 ENCOUNTER — Emergency Department (HOSPITAL_BASED_OUTPATIENT_CLINIC_OR_DEPARTMENT_OTHER)
Admission: EM | Admit: 2018-02-18 | Discharge: 2018-02-18 | Disposition: A | Discharge status: Home / Self Care | Payer: BC Managed Care – PPO | Attending: Emergency Medicine | Admitting: Emergency Medicine

## 2018-02-18 DIAGNOSIS — J45909 Unspecified asthma, uncomplicated: Secondary | ICD-10-CM | POA: Insufficient documentation

## 2018-02-18 DIAGNOSIS — R072 Precordial pain: Secondary | ICD-10-CM | POA: Insufficient documentation

## 2018-02-18 DIAGNOSIS — R079 Chest pain, unspecified: Secondary | ICD-10-CM | POA: Diagnosis present

## 2018-02-18 DIAGNOSIS — R0602 Shortness of breath: Secondary | ICD-10-CM | POA: Insufficient documentation

## 2018-02-18 HISTORY — DX: Unspecified asthma, uncomplicated: J45.909

## 2018-02-18 HISTORY — DX: Migraine, unspecified, not intractable, without status migrainosus: G43.909

## 2018-02-18 LAB — CBC WITH DIFFERENTIAL/PLATELET
Abs Immature Granulocytes: 0.02 10*3/uL (ref 0.00–0.07)
BASOS PCT: 1 %
Basophils Absolute: 0 10*3/uL (ref 0.0–0.1)
EOS ABS: 0.1 10*3/uL (ref 0.0–0.5)
Eosinophils Relative: 2 %
HEMATOCRIT: 43.7 % (ref 36.0–46.0)
Hemoglobin: 14.2 g/dL (ref 12.0–15.0)
Immature Granulocytes: 0 %
Lymphocytes Relative: 36 %
Lymphs Abs: 2.1 10*3/uL (ref 0.7–4.0)
MCH: 29.3 pg (ref 26.0–34.0)
MCHC: 32.5 g/dL (ref 30.0–36.0)
MCV: 90.1 fL (ref 80.0–100.0)
MONOS PCT: 8 %
Monocytes Absolute: 0.4 10*3/uL (ref 0.1–1.0)
Neutro Abs: 3 10*3/uL (ref 1.7–7.7)
Neutrophils Relative %: 53 %
Platelets: 249 10*3/uL (ref 150–400)
RBC: 4.85 MIL/uL (ref 3.87–5.11)
RDW: 12.2 % (ref 11.5–15.5)
WBC: 5.7 10*3/uL (ref 4.0–10.5)
nRBC: 0 % (ref 0.0–0.2)

## 2018-02-18 LAB — BASIC METABOLIC PANEL
ANION GAP: 9 (ref 5–15)
BUN: 11 mg/dL (ref 6–20)
CO2: 25 mmol/L (ref 22–32)
Calcium: 9 mg/dL (ref 8.9–10.3)
Chloride: 104 mmol/L (ref 98–111)
Creatinine, Ser: 0.6 mg/dL (ref 0.44–1.00)
GFR calc Af Amer: >60 mL/min (ref >60)
Glucose, Bld: 89 mg/dL (ref 70–99)
Potassium: 4 mmol/L (ref 3.5–5.1)
SODIUM: 138 mmol/L (ref 135–145)

## 2018-02-18 LAB — PREGNANCY, URINE: Preg Test, Ur: NEGATIVE

## 2018-02-18 LAB — TROPONIN I

## 2018-02-18 LAB — D-DIMER, QUANTITATIVE: D-Dimer, Quant: 0.42 ug/mL-FEU (ref 0.00–0.50)

## 2018-02-18 MED ORDER — METHOCARBAMOL 500 MG PO TABS: 500.0000 mg | ORAL_TABLET | Freq: Three times a day (TID) | ORAL | 0 refills | Status: AC | PRN

## 2018-02-18 MED ORDER — IBUPROFEN 800 MG PO TABS: 800.0000 mg | ORAL_TABLET | Freq: Three times a day (TID) | ORAL | 0 refills | Status: AC | PRN

## 2018-02-18 NOTE — ED Provider Notes (Addendum)
Emergency Department Provider Note   I have reviewed the triage vital signs and the nursing notes.   HISTORY  Chief Complaint Chest Pain   HPI Amber Weber is a 38 y.o. female with PMH of pneumomediastinum, asthma, and migraines presents to the emergency department for evaluation of chest pain.  Symptoms have been ongoing for the past several days.  She reports some associated dizziness and occasional cough.  She feels that she cannot take a full breath.  She was evaluated briefly at urgent care where she reportedly had a chest x-ray was indeterminant.  She was sent to the emergency department for further evaluation.  The patient has no prior history of DVT/PE.  She is not experiencing fevers, productive cough, vomiting.  No recent episodes of forceful coughing or vomiting.  Denies any abdominal or back pain.  No radiation of chest pain or other modifying factors.  Past Medical History:  Diagnosis Date  . Asthma   . Migraines   . Pneumomediastinum (HCC)    several times.    Patient Active Problem List   Diagnosis Date Noted  . DYSPNEA 03/04/2008  . ASTHMA 02/29/2008    Past Surgical History:  Procedure Laterality Date  . APPENDECTOMY    . OVARIAN CYST REMOVAL     Allergies Morphine and related  No family history on file.  Social History Social History   Tobacco Use  . Smoking status: Never Smoker  . Smokeless tobacco: Never Used  Substance Use Topics  . Alcohol use: No  . Drug use: No    Review of Systems  Constitutional: No fever/chills Eyes: No visual changes. ENT: No sore throat. Cardiovascular: Positive chest pain. Respiratory: Positive shortness of breath. Gastrointestinal: No abdominal pain.  No nausea, no vomiting.  No diarrhea.  No constipation. Genitourinary: Negative for dysuria. Musculoskeletal: Negative for back pain. Skin: Negative for rash. Neurological: Negative for headaches, focal weakness or numbness.  10-point ROS otherwise  negative.  ____________________________________________   PHYSICAL EXAM:  VITAL SIGNS: ED Triage Vitals  Enc Vitals Group     BP 02/18/18 1227 (!) 139/97     Pulse Rate 02/18/18 1227 74     Resp 02/18/18 1227 20     Temp 02/18/18 1227 97.9 F (36.6 C)     Temp Source 02/18/18 1227 Oral     SpO2 02/18/18 1227 98 %     Weight 02/18/18 1226 141 lb (64 kg)     Height 02/18/18 1226 5\' 6"  (1.676 m)     Pain Score 02/18/18 1226 6   Constitutional: Alert and oriented. Well appearing and in no acute distress. Eyes: Conjunctivae are normal.  Head: Atraumatic. Nose: No congestion/rhinnorhea. Mouth/Throat: Mucous membranes are moist.  Oropharynx non-erythematous. Neck: No stridor Cardiovascular: Normal rate, regular rhythm. Good peripheral circulation. Grossly normal heart sounds.   Respiratory: Normal respiratory effort.  No retractions. Lungs CTAB. Gastrointestinal: Soft and nontender. No distention.  Musculoskeletal: No lower extremity tenderness nor edema. No gross deformities of extremities. Tenderness to palpation over the left anterior chest wall that reproduces the pain.  Neurologic:  Normal speech and language. No gross focal neurologic deficits are appreciated.  Skin:  Skin is warm, dry and intact. No rash noted.   ____________________________________________   LABS (all labs ordered are listed, but only abnormal results are displayed)  Labs Reviewed  BASIC METABOLIC PANEL  TROPONIN I  CBC WITH DIFFERENTIAL/PLATELET  PREGNANCY, URINE  D-DIMER, QUANTITATIVE (NOT AT [facility])   ____________________________________________  EKG  Rate: 74 Normal intervals.  NSR.  Normal axis. No ST elevation or depression.  Normal T waves.  No STEMI.  ____________________________________________  RADIOLOGY  Dg Chest 2 View  Result Date: 02/18/2018 CLINICAL DATA:  Chest pain with shortness of breath for 3 days. EXAM: CHEST - 2 VIEW COMPARISON:  CT 12/02/2013.  Radiographs  12/01/2013. FINDINGS: The heart size and mediastinal contours are normal. The lungs are clear. There is no pleural effusion or pneumothorax. No acute osseous findings are identified. Telemetry leads overlie the chest. IMPRESSION: Stable examination.  No active cardiopulmonary process. Electronically Signed   By: Carey Bullocks M.D.   On: 02/18/2018 14:17   Ct Chest Wo Contrast  Result Date: 02/18/2018 CLINICAL DATA:  Anterior midsternal and left-sided chest pain. Shortness of breath. EXAM: CT CHEST WITHOUT CONTRAST TECHNIQUE: Multidetector CT imaging of the chest was performed following the standard protocol without IV contrast. COMPARISON:  Chest x-ray February 18, 2018 and CT of the chest December 02, 2013. FINDINGS: Cardiovascular: No significant vascular findings. Normal heart size. No pericardial effusion. Mediastinum/Nodes: No enlarged mediastinal or axillary lymph nodes. Thyroid gland, trachea, and esophagus demonstrate no significant findings. Lungs/Pleura: Lungs are clear. No pleural effusion or pneumothorax. Upper Abdomen: No acute abnormality. Musculoskeletal: No chest wall mass or suspicious bone lesions identified. IMPRESSION: No cause for the patient's symptoms identified. No acute abnormalities. Electronically Signed   By: Gerome Sam III M.D   On: 02/18/2018 15:41    ____________________________________________   PROCEDURES  Procedure(s) performed:   Procedures  None ____________________________________________   INITIAL IMPRESSION / ASSESSMENT AND PLAN / ED COURSE  Pertinent labs & imaging results that were available during my care of the patient were reviewed by me and considered in my medical decision making (see chart for details).  Patient with past medical history of pneumomediastinum on multiple occasions presents with chest pain similar to her prior episodes of this.  She was sent from her urgent care.  Chest x-ray here with no evidence of pneumomediastinum.  Lab  work including d-dimer and troponin are negative.  EKG is nonischemic.  I discussed the findings with the patient and she verbalized concern that in the past her x-rays have been nondiagnostic but upon CT imaging the pneumomediastinum was found.  Given that history, agreed to proceed with noncontrast CT scan of the chest which confirmed no pneumomediastinum.  My suspicion for PE is extremely low, therefore angiography was not obtained.  Plan for Motrin, Robaxin, close PCP follow-up.   ____________________________________________  FINAL CLINICAL IMPRESSION(S) / ED DIAGNOSES  Final diagnoses:  Precordial chest pain  SOB (shortness of breath)     NEW OUTPATIENT MEDICATIONS STARTED DURING THIS VISIT:  Discharge Medication List as of 02/18/2018  3:53 PM    START taking these medications   Details  ibuprofen (ADVIL,MOTRIN) 800 MG tablet Take 1 tablet (800 mg total) by mouth every 8 (eight) hours as needed for moderate pain., Starting Sat 02/18/2018, Normal    methocarbamol (ROBAXIN) 500 MG tablet Take 1 tablet (500 mg total) by mouth every 8 (eight) hours as needed., Starting Sat 02/18/2018, Normal        Note:  This document was prepared using Dragon voice recognition software and may include unintentional dictation errors.  Alona Bene, MD Emergency Medicine    Ophie Burrowes, Arlyss Repress, MD 02/18/18 Edison Pace, MD 02/18/18 [phone]

## 2018-02-18 NOTE — Discharge Instructions (Signed)

## 2018-02-18 NOTE — ED Triage Notes (Signed)
Chest pressure and SOB for several days. Sent from [facility] for further eval. Also reports dizziness.

## 2019-03-24 IMAGING — CT CT CHEST W/O CM
2 of 3 series · 15 of 36 positions shown, 18 images · non-contrast
Comparison: Chest x-ray February 18, 2018 and CT of the chest
December 02, 2013.

CLINICAL DATA: Anterior midsternal and left-sided chest pain.
Shortness of breath.

EXAM:
CT CHEST WITHOUT CONTRAST
TECHNIQUE: Multidetector CT imaging of the chest was performed following the
standard protocol without IV contrast.

[Series 2: thorax · axial · 0.76mm/px · z∈[-307,-23]mm · 12 of 168 slices shown, 15 images]
[im 13/168  mediastinal]
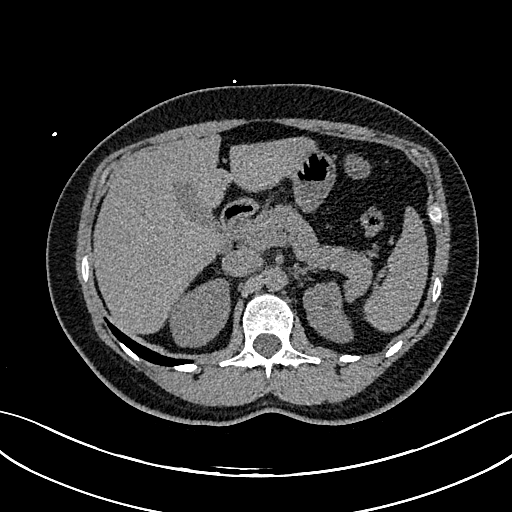
[im 13/168  lung]
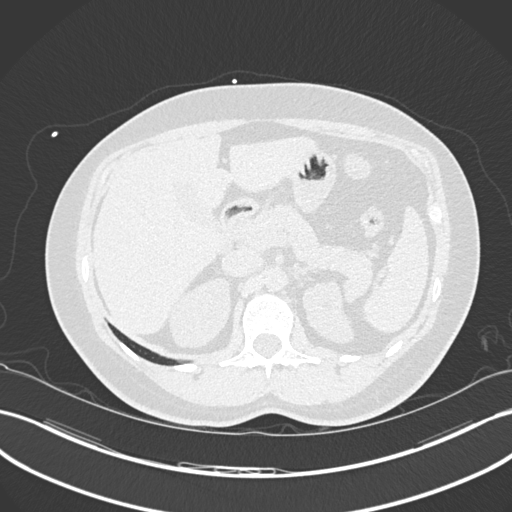
[im 25/168  lung]
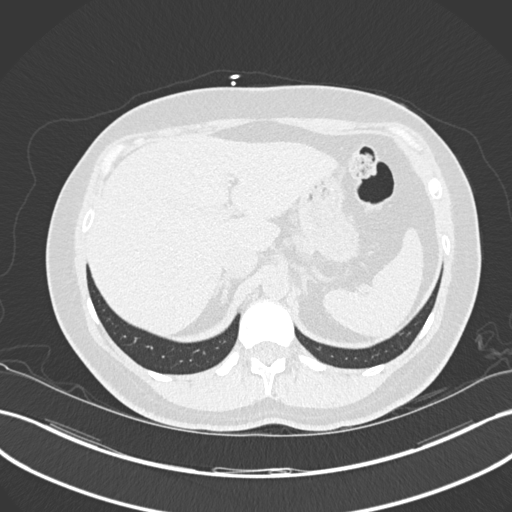
[im 38/168  lung]
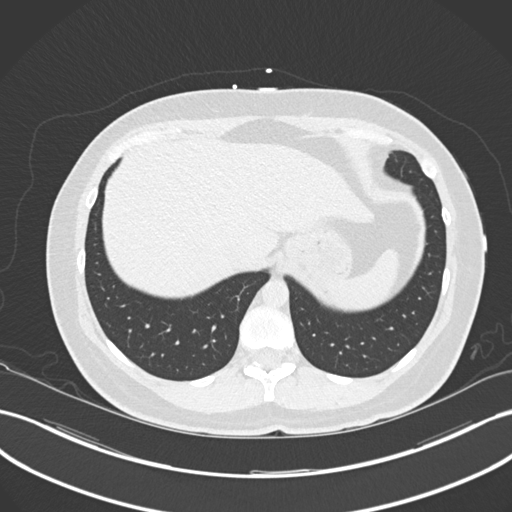
[im 50/168  lung]
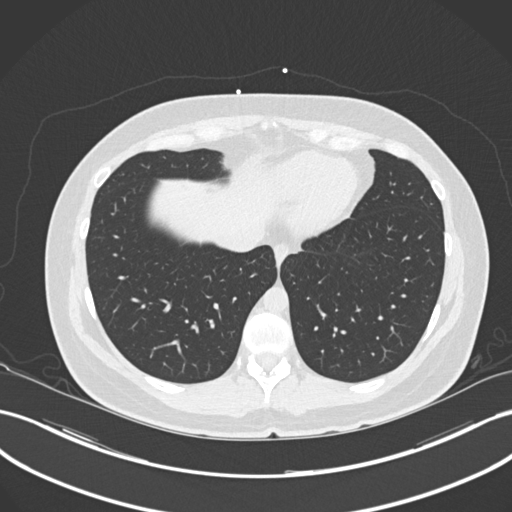
[im 62/168  mediastinal]
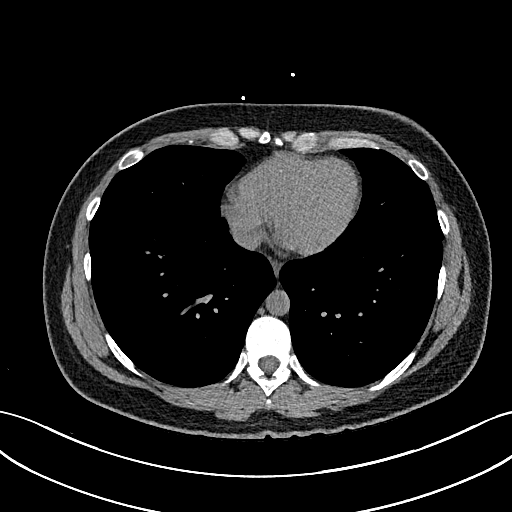
[im 62/168  lung]
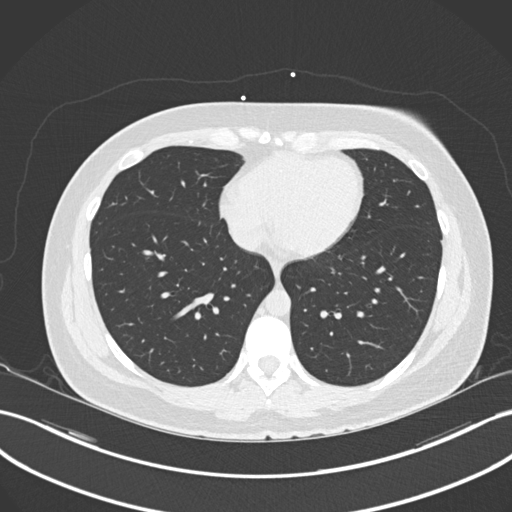
[im 75/168  lung]
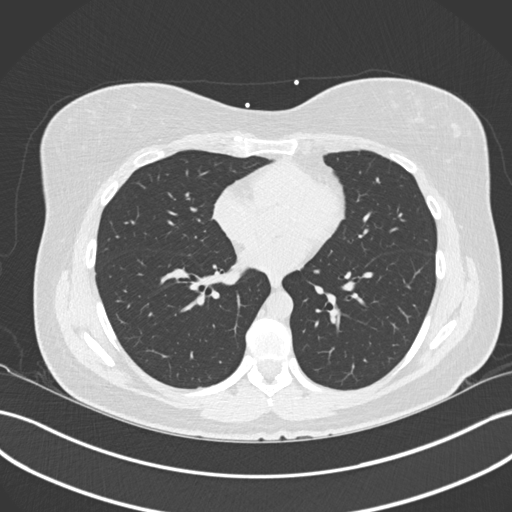
[im 93/168  lung]
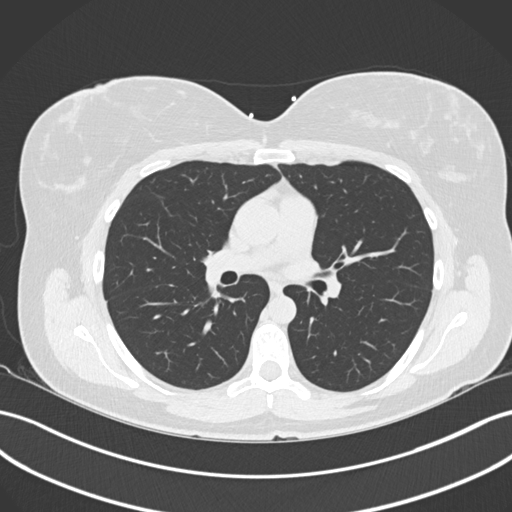
[im 106/168  lung]
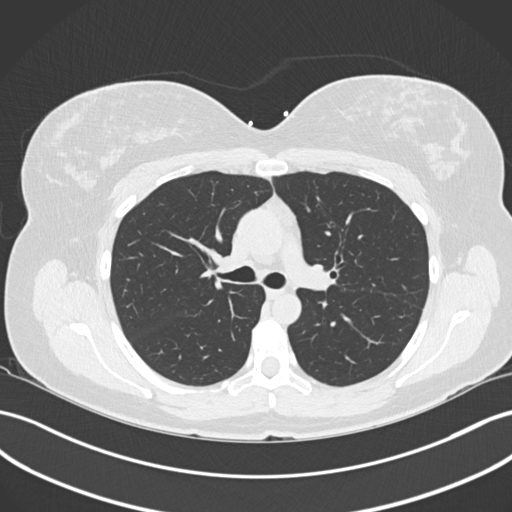
[im 118/168  mediastinal]
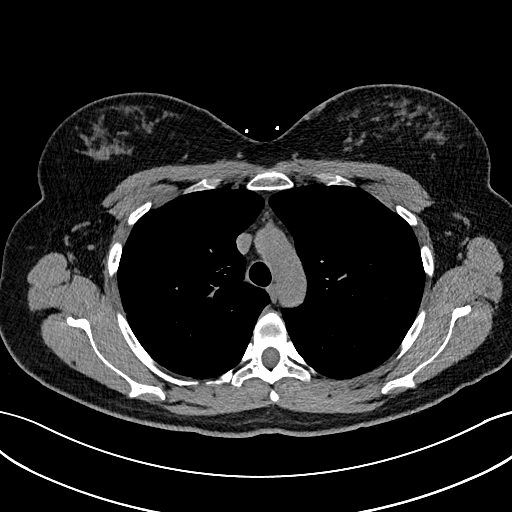
[im 118/168  lung]
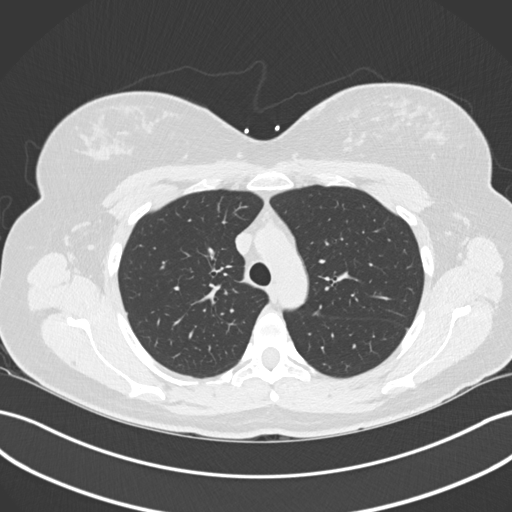
[im 130/168  lung]
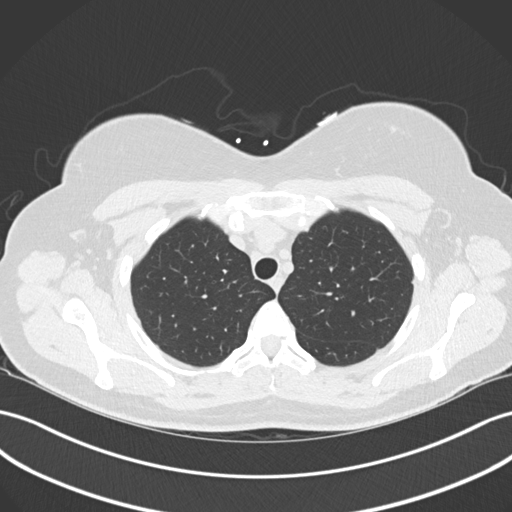
[im 143/168  lung]
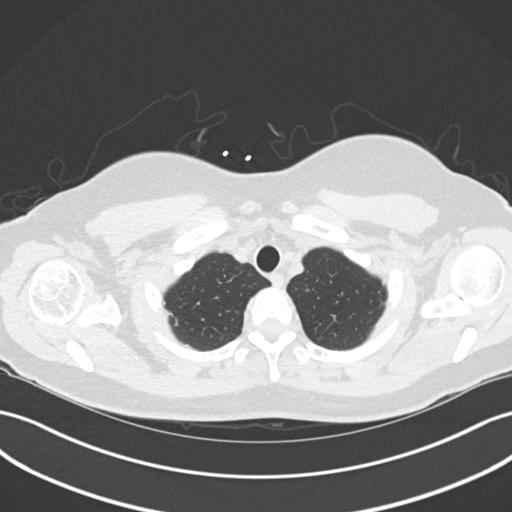
[im 155/168  lung]
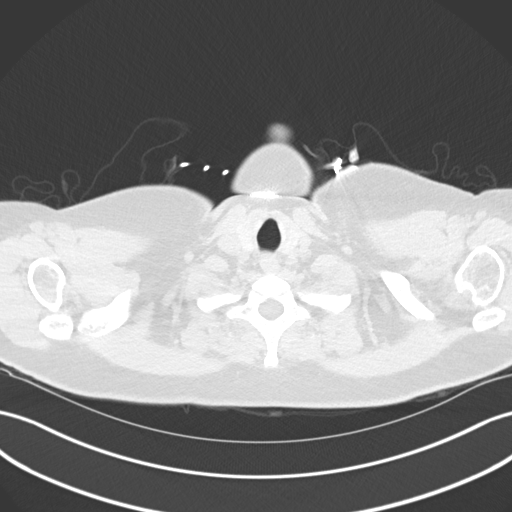

[Series 5: coronal · coronal · 0.65mm/px · 3 of 148 slices shown]
[im 30/148  lung]
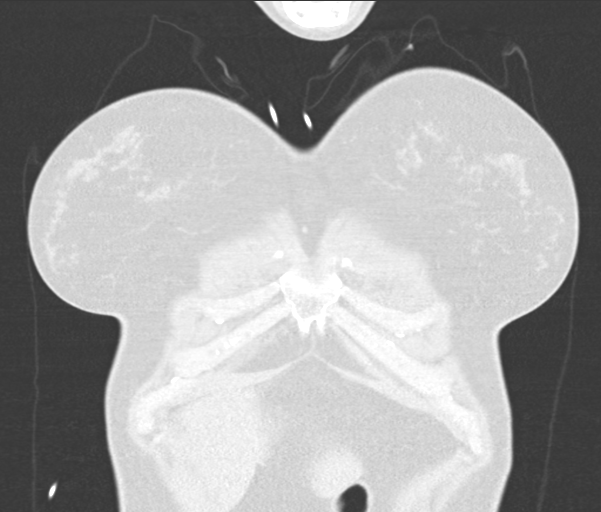
[im 59/148  lung]
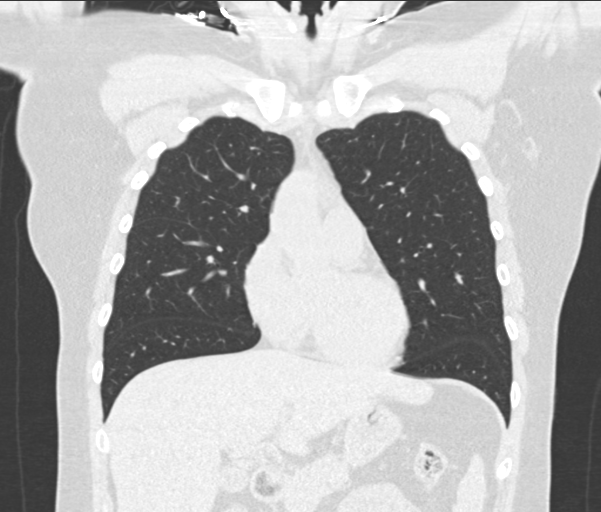
[im 89/148  lung]
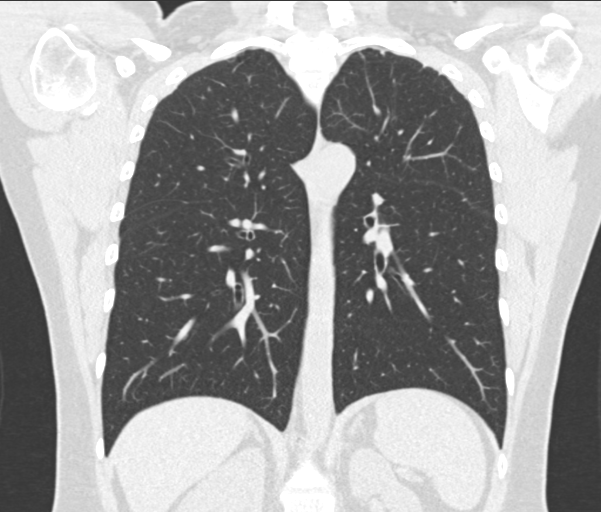

[15 of 36 positions shown; findings below may reference images not displayed]

FINDINGS: Cardiovascular: No significant vascular findings. Normal heart size.
No pericardial effusion.

Mediastinum/Nodes: No enlarged mediastinal or axillary lymph nodes.
Thyroid gland, trachea, and esophagus demonstrate no significant
findings.

Lungs/Pleura: Lungs are clear. No pleural effusion or pneumothorax.

Upper Abdomen: No acute abnormality.

Musculoskeletal: No chest wall mass or suspicious bone lesions
identified.
IMPRESSION: No cause for the patient's symptoms identified. No acute
abnormalities.
